# Patient Record
Sex: Male | Born: 1965 | Race: White | Hispanic: No | Marital: Single
Health system: Southern US, Community
[De-identification: ages and names within clinical notes are randomized; demographics above are authoritative.]

---

## 2007-02-10 ENCOUNTER — Inpatient Hospital Stay: Payer: Self-pay | Admitting: Internal Medicine

## 2007-02-10 ENCOUNTER — Other Ambulatory Visit: Payer: Self-pay

## 2007-02-18 ENCOUNTER — Ambulatory Visit: Payer: Self-pay | Admitting: Internal Medicine

## 2007-03-02 ENCOUNTER — Other Ambulatory Visit: Payer: Self-pay

## 2007-03-02 ENCOUNTER — Inpatient Hospital Stay: Payer: Self-pay | Admitting: Internal Medicine

## 2009-12-16 ENCOUNTER — Emergency Department: Payer: Self-pay | Admitting: Emergency Medicine

## 2010-02-08 ENCOUNTER — Inpatient Hospital Stay: Payer: Self-pay | Admitting: Specialist

## 2011-02-23 LAB — COMPREHENSIVE METABOLIC PANEL
Bilirubin,Total: 0.6 mg/dL (ref 0.2–1.0)
Calcium, Total: 9.4 mg/dL (ref 8.5–10.1)
Chloride: 103 mmol/L (ref 98–107)
Co2: 32 mmol/L (ref 21–32)
Creatinine: 1.67 mg/dL — ABNORMAL HIGH (ref 0.60–1.30)
EGFR (African American): 58 — ABNORMAL LOW
EGFR (Non-African Amer.): 48 — ABNORMAL LOW
Osmolality: 292 (ref 275–301)
Potassium: 3.7 mmol/L (ref 3.5–5.1)
SGPT (ALT): 32 U/L
Sodium: 144 mmol/L (ref 136–145)
Total Protein: 6.9 g/dL (ref 6.4–8.2)

## 2011-02-23 LAB — CBC
MCH: 28.7 pg (ref 26.0–34.0)
MCHC: 32.5 g/dL (ref 32.0–36.0)
MCV: 88 fL (ref 80–100)
Platelet: 209 10*3/uL (ref 150–440)
RBC: 5.26 10*6/uL (ref 4.40–5.90)
RDW: 13.6 % (ref 11.5–14.5)
WBC: 9.6 10*3/uL (ref 3.8–10.6)

## 2011-02-23 LAB — PRO B NATRIURETIC PEPTIDE: B-Type Natriuretic Peptide: 3780 pg/mL — ABNORMAL HIGH (ref 0–125)

## 2011-02-24 ENCOUNTER — Inpatient Hospital Stay: Payer: Self-pay | Admitting: Internal Medicine

## 2011-02-24 LAB — BASIC METABOLIC PANEL
BUN: 21 mg/dL — ABNORMAL HIGH (ref 7–18)
Calcium, Total: 9.3 mg/dL (ref 8.5–10.1)
Chloride: 100 mmol/L (ref 98–107)
EGFR (African American): >60
EGFR (Non-African Amer.): 53 — ABNORMAL LOW
Glucose: 167 mg/dL — ABNORMAL HIGH (ref 65–99)
Osmolality: 284 (ref 275–301)
Potassium: 3.1 mmol/L — ABNORMAL LOW (ref 3.5–5.1)

## 2011-02-24 LAB — CK TOTAL AND CKMB (NOT AT ARMC)
CK, Total: 95 U/L (ref 35–232)
CK-MB: 2.5 ng/mL (ref 0.5–3.6)
CK-MB: 1.6 ng/mL (ref 0.5–3.6)

## 2011-02-24 LAB — MAGNESIUM: Magnesium: 1.3 mg/dL — ABNORMAL LOW

## 2011-02-24 LAB — TROPONIN I: Troponin-I: 0.03 ng/mL

## 2011-02-25 LAB — BASIC METABOLIC PANEL
BUN: 24 mg/dL — ABNORMAL HIGH (ref 7–18)
Calcium, Total: 8.6 mg/dL (ref 8.5–10.1)
Creatinine: 1.42 mg/dL — ABNORMAL HIGH (ref 0.60–1.30)
EGFR (African American): >60
EGFR (Non-African Amer.): 58 — ABNORMAL LOW
Glucose: 119 mg/dL — ABNORMAL HIGH (ref 65–99)
Sodium: 139 mmol/L (ref 136–145)

## 2011-02-25 LAB — URINALYSIS, COMPLETE
Bacteria: NONE SEEN
Bilirubin,UR: NEGATIVE
Glucose,UR: NEGATIVE mg/dL (ref 0–75)
Ketone: NEGATIVE
Nitrite: NEGATIVE
Ph: 6 (ref 4.5–8.0)
Specific Gravity: 1.008 (ref 1.003–1.030)
Squamous Epithelial: NONE SEEN
WBC UR: <1 /HPF (ref 0–5)

## 2011-02-26 LAB — BASIC METABOLIC PANEL
BUN: 18 mg/dL (ref 7–18)
Chloride: 100 mmol/L (ref 98–107)
Creatinine: 1.24 mg/dL (ref 0.60–1.30)
EGFR (African American): >60
EGFR (Non-African Amer.): >60
Glucose: 98 mg/dL (ref 65–99)
Osmolality: 281 (ref 275–301)
Potassium: 3.5 mmol/L (ref 3.5–5.1)

## 2011-02-26 LAB — MAGNESIUM: Magnesium: 2 mg/dL

## 2011-02-26 LAB — APTT: Activated PTT: 91.5 secs — ABNORMAL HIGH (ref 23.6–35.9)

## 2011-02-27 LAB — CBC WITH DIFFERENTIAL/PLATELET
Eosinophil %: 1.3 %
HCT: 40.1 % (ref 40.0–52.0)
Lymphocyte #: 1.5 10*3/uL (ref 1.0–3.6)
MCH: 28.4 pg (ref 26.0–34.0)
MCHC: 32.3 g/dL (ref 32.0–36.0)
MCV: 88 fL (ref 80–100)
Monocyte #: 1.1 10*3/uL — ABNORMAL HIGH (ref 0.0–0.7)
Monocyte %: 10.9 %
Neutrophil #: 7 10*3/uL — ABNORMAL HIGH (ref 1.4–6.5)
Neutrophil %: 72.3 %
Platelet: 170 10*3/uL (ref 150–440)
RBC: 4.56 10*6/uL (ref 4.40–5.90)
WBC: 9.7 10*3/uL (ref 3.8–10.6)

## 2011-02-27 LAB — BASIC METABOLIC PANEL
Anion Gap: 9 (ref 7–16)
Calcium, Total: 8.1 mg/dL — ABNORMAL LOW (ref 8.5–10.1)
EGFR (African American): >60
EGFR (Non-African Amer.): >60
Glucose: 110 mg/dL — ABNORMAL HIGH (ref 65–99)
Osmolality: 276 (ref 275–301)

## 2011-02-27 LAB — URINE CULTURE

## 2011-02-28 LAB — BASIC METABOLIC PANEL
Anion Gap: 9 (ref 7–16)
BUN: 17 mg/dL (ref 7–18)
Co2: 31 mmol/L (ref 21–32)
Creatinine: 0.96 mg/dL (ref 0.60–1.30)
Glucose: 91 mg/dL (ref 65–99)

## 2011-02-28 LAB — PROTIME-INR
INR: 1.1
Prothrombin Time: 14.7 secs (ref 11.5–14.7)

## 2012-02-06 ENCOUNTER — Emergency Department: Payer: Self-pay

## 2012-02-06 LAB — CK TOTAL AND CKMB (NOT AT ARMC)
CK, Total: 256 U/L — ABNORMAL HIGH (ref 35–232)
CK-MB: 2.3 ng/mL (ref 0.5–3.6)

## 2012-02-06 LAB — BASIC METABOLIC PANEL
BUN: 12 mg/dL (ref 7–18)
Chloride: 104 mmol/L (ref 98–107)
Co2: 27 mmol/L (ref 21–32)
Creatinine: 1.13 mg/dL (ref 0.60–1.30)
EGFR (Non-African Amer.): >60
Glucose: 129 mg/dL — ABNORMAL HIGH (ref 65–99)
Osmolality: 279 (ref 275–301)
Potassium: 3.7 mmol/L (ref 3.5–5.1)
Sodium: 139 mmol/L (ref 136–145)

## 2012-02-06 LAB — TROPONIN I: Troponin-I: 0.03 ng/mL

## 2012-02-06 LAB — CBC
HCT: 50 % (ref 40.0–52.0)
MCV: 86 fL (ref 80–100)
RBC: 5.79 10*6/uL (ref 4.40–5.90)
RDW: 14.5 % (ref 11.5–14.5)
WBC: 11 10*3/uL — ABNORMAL HIGH (ref 3.8–10.6)

## 2012-02-06 LAB — APTT: Activated PTT: 30.4 secs (ref 23.6–35.9)

## 2012-02-06 LAB — PROTIME-INR: INR: 1

## 2012-09-03 ENCOUNTER — Inpatient Hospital Stay: Payer: Self-pay | Admitting: Internal Medicine

## 2012-09-03 LAB — COMPREHENSIVE METABOLIC PANEL
Anion Gap: 8 (ref 7–16)
BUN: 15 mg/dL (ref 7–18)
Co2: 28 mmol/L (ref 21–32)
Creatinine: 2 mg/dL — ABNORMAL HIGH (ref 0.60–1.30)
EGFR (African American): 45 — ABNORMAL LOW
Glucose: 128 mg/dL — ABNORMAL HIGH (ref 65–99)
Osmolality: 276 (ref 275–301)
Potassium: 3.3 mmol/L — ABNORMAL LOW (ref 3.5–5.1)
SGOT(AST): 15 U/L (ref 15–37)
SGPT (ALT): 15 U/L (ref 12–78)
Sodium: 137 mmol/L (ref 136–145)

## 2012-09-03 LAB — URINALYSIS, COMPLETE
Glucose,UR: NEGATIVE mg/dL (ref 0–75)
Ph: 5 (ref 4.5–8.0)
Protein: 100
RBC,UR: 2 /HPF (ref 0–5)
Specific Gravity: 1.024 (ref 1.003–1.030)
Squamous Epithelial: NONE SEEN
WBC UR: 3 /HPF (ref 0–5)

## 2012-09-03 LAB — CBC
HCT: 41.6 % (ref 40.0–52.0)
HGB: 13.7 g/dL (ref 13.0–18.0)
MCH: 28 pg (ref 26.0–34.0)
MCHC: 33 g/dL (ref 32.0–36.0)
MCV: 85 fL (ref 80–100)
Platelet: 223 x10 3/mm 3 (ref 150–440)
RBC: 4.89 x10 6/mm 3 (ref 4.40–5.90)
RDW: 15.6 % — ABNORMAL HIGH (ref 11.5–14.5)
WBC: 16.4 x10 3/mm 3 — ABNORMAL HIGH (ref 3.8–10.6)

## 2012-09-03 LAB — TROPONIN I: Troponin-I: 0.07 ng/mL — ABNORMAL HIGH

## 2012-09-03 LAB — CK TOTAL AND CKMB (NOT AT ARMC)
CK, Total: 131 U/L (ref 35–232)
CK-MB: 0.5 ng/mL (ref 0.5–3.6)

## 2012-09-03 LAB — APTT: Activated PTT: 29.2 s (ref 23.6–35.9)

## 2012-09-04 LAB — APTT
Activated PTT: 61.9 secs — ABNORMAL HIGH (ref 23.6–35.9)
Activated PTT: 45.7 secs — ABNORMAL HIGH (ref 23.6–35.9)
Activated PTT: 87.2 secs — ABNORMAL HIGH (ref 23.6–35.9)

## 2012-09-04 LAB — BASIC METABOLIC PANEL
Anion Gap: 7 (ref 7–16)
BUN: 18 mg/dL (ref 7–18)
Chloride: 100 mmol/L (ref 98–107)
EGFR (African American): >60
EGFR (Non-African Amer.): 59 — ABNORMAL LOW
Glucose: 121 mg/dL — ABNORMAL HIGH (ref 65–99)
Osmolality: 271 (ref 275–301)
Sodium: 134 mmol/L — ABNORMAL LOW (ref 136–145)

## 2012-09-04 LAB — CBC WITH DIFFERENTIAL/PLATELET
Basophil #: 0 10*3/uL (ref 0.0–0.1)
Eosinophil #: 0 10*3/uL (ref 0.0–0.7)
HCT: 40.3 % (ref 40.0–52.0)
Lymphocyte #: 0.5 10*3/uL — ABNORMAL LOW (ref 1.0–3.6)
Lymphocyte %: 2.5 %
MCH: 28.2 pg (ref 26.0–34.0)
Monocyte #: 0.4 x10 3/mm (ref 0.2–1.0)
Platelet: 199 10*3/uL (ref 150–440)
RDW: 15.9 % — ABNORMAL HIGH (ref 11.5–14.5)
WBC: 19.7 10*3/uL — ABNORMAL HIGH (ref 3.8–10.6)

## 2012-09-04 LAB — CK TOTAL AND CKMB (NOT AT ARMC)
CK, Total: 104 U/L (ref 35–232)
CK, Total: 83 U/L (ref 35–232)
CK-MB: <0.5 ng/mL — ABNORMAL LOW (ref 0.5–3.6)
CK-MB: <0.5 ng/mL — ABNORMAL LOW (ref 0.5–3.6)

## 2012-09-04 LAB — LIPID PANEL
Ldl Cholesterol, Calc: 24 mg/dL (ref 0–100)
Triglycerides: 39 mg/dL (ref 0–200)

## 2012-09-04 LAB — TROPONIN I
Troponin-I: 0.07 ng/mL — ABNORMAL HIGH
Troponin-I: 0.06 ng/mL — ABNORMAL HIGH

## 2012-09-04 LAB — HEMOGLOBIN A1C: Hemoglobin A1C: 7.1 % — ABNORMAL HIGH (ref 4.2–6.3)

## 2012-09-04 LAB — MAGNESIUM: Magnesium: 1.1 mg/dL — ABNORMAL LOW

## 2012-09-04 LAB — TSH: Thyroid Stimulating Horm: 1.52 u[IU]/mL

## 2012-09-05 LAB — CBC WITH DIFFERENTIAL/PLATELET
Basophil #: 0 10*3/uL (ref 0.0–0.1)
Basophil %: 0.2 %
Eosinophil #: 0 10*3/uL (ref 0.0–0.7)
Eosinophil %: 0.1 %
HCT: 39.2 % — ABNORMAL LOW (ref 40.0–52.0)
HGB: 13 g/dL (ref 13.0–18.0)
Lymphocyte #: 0.8 10*3/uL — ABNORMAL LOW (ref 1.0–3.6)
Lymphocyte %: 4.9 %
MCH: 28.3 pg (ref 26.0–34.0)
MCHC: 33.3 g/dL (ref 32.0–36.0)
MCV: 85 fL (ref 80–100)
Monocyte #: 0.8 x10 3/mm (ref 0.2–1.0)
Monocyte %: 4.7 %
Neutrophil #: 14.6 10*3/uL — ABNORMAL HIGH (ref 1.4–6.5)
Neutrophil %: 90.1 %
Platelet: 180 10*3/uL (ref 150–440)
RBC: 4.6 10*6/uL (ref 4.40–5.90)
RDW: 15.7 % — ABNORMAL HIGH (ref 11.5–14.5)
WBC: 16.2 10*3/uL — ABNORMAL HIGH (ref 3.8–10.6)

## 2012-09-05 LAB — PROTIME-INR
INR: 1.5
Prothrombin Time: 17.8 secs — ABNORMAL HIGH (ref 11.5–14.7)

## 2012-09-05 LAB — BASIC METABOLIC PANEL
Anion Gap: 9 (ref 7–16)
BUN: 29 mg/dL — ABNORMAL HIGH (ref 7–18)
Calcium, Total: 8.8 mg/dL (ref 8.5–10.1)
Chloride: 94 mmol/L — ABNORMAL LOW (ref 98–107)
Co2: 27 mmol/L (ref 21–32)
Creatinine: 1.78 mg/dL — ABNORMAL HIGH (ref 0.60–1.30)
EGFR (African American): 52 — ABNORMAL LOW
EGFR (Non-African Amer.): 45 — ABNORMAL LOW
Glucose: 118 mg/dL — ABNORMAL HIGH (ref 65–99)
Osmolality: 268 (ref 275–301)
Potassium: 3.8 mmol/L (ref 3.5–5.1)
Sodium: 130 mmol/L — ABNORMAL LOW (ref 136–145)

## 2012-09-05 LAB — APTT
Activated PTT: 54.3 secs — ABNORMAL HIGH (ref 23.6–35.9)
Activated PTT: 54.5 secs — ABNORMAL HIGH (ref 23.6–35.9)
Activated PTT: 75.7 secs — ABNORMAL HIGH (ref 23.6–35.9)

## 2012-09-05 LAB — URINE CULTURE

## 2012-09-05 LAB — MAGNESIUM: Magnesium: 1.8 mg/dL

## 2012-09-06 LAB — BASIC METABOLIC PANEL
Calcium, Total: 8.9 mg/dL (ref 8.5–10.1)
Chloride: 94 mmol/L — ABNORMAL LOW (ref 98–107)
EGFR (African American): >60
EGFR (Non-African Amer.): >60
Sodium: 130 mmol/L — ABNORMAL LOW (ref 136–145)

## 2012-09-06 LAB — CBC WITH DIFFERENTIAL/PLATELET
Basophil #: 0 10*3/uL (ref 0.0–0.1)
Basophil %: 0.4 %
Eosinophil %: 0.3 %
HCT: 37.3 % — ABNORMAL LOW (ref 40.0–52.0)
HGB: 12.4 g/dL — ABNORMAL LOW (ref 13.0–18.0)
Lymphocyte %: 7.8 %
MCHC: 33.2 g/dL (ref 32.0–36.0)
MCV: 85 fL (ref 80–100)
Monocyte #: 0.8 x10 3/mm (ref 0.2–1.0)
Neutrophil %: 85.5 %
Platelet: 180 10*3/uL (ref 150–440)
RBC: 4.4 10*6/uL (ref 4.40–5.90)
RDW: 15.3 % — ABNORMAL HIGH (ref 11.5–14.5)
WBC: 13.3 10*3/uL — ABNORMAL HIGH (ref 3.8–10.6)

## 2012-09-06 LAB — APTT: Activated PTT: 64.7 secs — ABNORMAL HIGH (ref 23.6–35.9)

## 2012-09-06 LAB — PROTIME-INR
INR: 1.6
Prothrombin Time: 18.6 secs — ABNORMAL HIGH (ref 11.5–14.7)

## 2012-09-07 LAB — BASIC METABOLIC PANEL
Anion Gap: 9 (ref 7–16)
BUN: 25 mg/dL — ABNORMAL HIGH (ref 7–18)
Calcium, Total: 9.2 mg/dL (ref 8.5–10.1)
Chloride: 95 mmol/L — ABNORMAL LOW (ref 98–107)
Co2: 29 mmol/L (ref 21–32)
Creatinine: 0.86 mg/dL (ref 0.60–1.30)
EGFR (African American): >60
EGFR (Non-African Amer.): >60
Glucose: 91 mg/dL (ref 65–99)
Osmolality: 270 (ref 275–301)
Potassium: 4.8 mmol/L (ref 3.5–5.1)
Sodium: 133 mmol/L — ABNORMAL LOW (ref 136–145)

## 2012-09-07 LAB — PROTIME-INR: INR: 1.8

## 2012-09-07 LAB — APTT: Activated PTT: 96.4 secs — ABNORMAL HIGH (ref 23.6–35.9)

## 2012-09-08 LAB — BASIC METABOLIC PANEL
Anion Gap: 4 — ABNORMAL LOW (ref 7–16)
BUN: 19 mg/dL — ABNORMAL HIGH (ref 7–18)
Calcium, Total: 9.3 mg/dL (ref 8.5–10.1)
Chloride: 94 mmol/L — ABNORMAL LOW (ref 98–107)
Co2: 33 mmol/L — ABNORMAL HIGH (ref 21–32)
Creatinine: 1.03 mg/dL (ref 0.60–1.30)
EGFR (Non-African Amer.): >60
Glucose: 90 mg/dL (ref 65–99)
Potassium: 3.7 mmol/L (ref 3.5–5.1)

## 2012-09-08 LAB — APTT: Activated PTT: 61.4 secs — ABNORMAL HIGH (ref 23.6–35.9)

## 2012-09-08 LAB — HEMOGLOBIN: HGB: 14.2 g/dL (ref 13.0–18.0)

## 2012-09-09 LAB — PROTIME-INR
INR: 1.8
Prothrombin Time: 20.5 secs — ABNORMAL HIGH (ref 11.5–14.7)

## 2012-09-09 LAB — APTT
Activated PTT: 55.9 secs — ABNORMAL HIGH (ref 23.6–35.9)
Activated PTT: 63.3 secs — ABNORMAL HIGH (ref 23.6–35.9)

## 2012-09-10 LAB — APTT: Activated PTT: 135.5 secs — ABNORMAL HIGH (ref 23.6–35.9)

## 2012-09-10 LAB — CBC WITH DIFFERENTIAL/PLATELET
Eosinophil %: 2.5 %
HCT: 37.6 % — ABNORMAL LOW (ref 40.0–52.0)
HGB: 12.5 g/dL — ABNORMAL LOW (ref 13.0–18.0)
Lymphocyte #: 1 10*3/uL (ref 1.0–3.6)
MCH: 28.1 pg (ref 26.0–34.0)
MCHC: 33.3 g/dL (ref 32.0–36.0)
MCV: 85 fL (ref 80–100)
Monocyte #: 0.8 x10 3/mm (ref 0.2–1.0)
Monocyte %: 8.8 %
Neutrophil %: 76 %
RBC: 4.45 10*6/uL (ref 4.40–5.90)
RDW: 15.8 % — ABNORMAL HIGH (ref 11.5–14.5)
WBC: 8.7 10*3/uL (ref 3.8–10.6)

## 2012-09-10 LAB — BASIC METABOLIC PANEL
Anion Gap: 4 — ABNORMAL LOW (ref 7–16)
BUN: 15 mg/dL (ref 7–18)
Calcium, Total: 8.8 mg/dL (ref 8.5–10.1)
Chloride: 89 mmol/L — ABNORMAL LOW (ref 98–107)
Co2: 40 mmol/L (ref 21–32)
Creatinine: 0.88 mg/dL (ref 0.60–1.30)
EGFR (African American): >60
Osmolality: 267 (ref 275–301)
Potassium: 3.4 mmol/L — ABNORMAL LOW (ref 3.5–5.1)

## 2012-09-10 LAB — PROTIME-INR: INR: 1.8

## 2012-09-11 LAB — PROTIME-INR: INR: 1.9

## 2012-09-11 LAB — APTT: Activated PTT: >160 secs (ref 23.6–35.9)

## 2012-09-12 LAB — PLATELET COUNT: Platelet: 390 10*3/uL (ref 150–440)

## 2012-09-12 LAB — BASIC METABOLIC PANEL
Anion Gap: 4 — ABNORMAL LOW (ref 7–16)
Chloride: 89 mmol/L — ABNORMAL LOW (ref 98–107)
Co2: 40 mmol/L (ref 21–32)
Creatinine: 0.94 mg/dL (ref 0.60–1.30)
EGFR (African American): >60
EGFR (Non-African Amer.): >60
Potassium: 3.5 mmol/L (ref 3.5–5.1)
Sodium: 133 mmol/L — ABNORMAL LOW (ref 136–145)

## 2012-09-12 LAB — PROTIME-INR: Prothrombin Time: 20 secs — ABNORMAL HIGH (ref 11.5–14.7)

## 2012-09-12 LAB — MAGNESIUM: Magnesium: 2 mg/dL

## 2012-09-12 LAB — APTT: Activated PTT: 92.9 secs — ABNORMAL HIGH (ref 23.6–35.9)

## 2012-09-13 LAB — APTT: Activated PTT: 149.9 secs — ABNORMAL HIGH (ref 23.6–35.9)

## 2012-09-13 LAB — PROTIME-INR: Prothrombin Time: 21.4 secs — ABNORMAL HIGH (ref 11.5–14.7)

## 2012-09-14 LAB — HEMOGLOBIN: HGB: 12.8 g/dL — ABNORMAL LOW (ref 13.0–18.0)

## 2012-09-14 LAB — PLATELET COUNT: Platelet: 414 10*3/uL (ref 150–440)

## 2012-09-14 LAB — APTT
Activated PTT: 112.6 secs — ABNORMAL HIGH (ref 23.6–35.9)
Activated PTT: 97.2 secs — ABNORMAL HIGH (ref 23.6–35.9)

## 2012-09-14 LAB — PROTIME-INR
INR: 1.9
Prothrombin Time: 21.1 secs — ABNORMAL HIGH (ref 11.5–14.7)

## 2012-12-20 ENCOUNTER — Inpatient Hospital Stay: Payer: Self-pay | Admitting: Internal Medicine

## 2012-12-20 LAB — CBC
HCT: 39.9 % — ABNORMAL LOW (ref 40.0–52.0)
MCH: 27.5 pg (ref 26.0–34.0)
MCV: 82 fL (ref 80–100)
Platelet: 266 10*3/uL (ref 150–440)
WBC: 14.4 10*3/uL — ABNORMAL HIGH (ref 3.8–10.6)

## 2012-12-20 LAB — URINALYSIS, COMPLETE
Bacteria: NONE SEEN
Ketone: NEGATIVE
Leukocyte Esterase: NEGATIVE
Nitrite: NEGATIVE
RBC,UR: <1 /HPF (ref 0–5)
Squamous Epithelial: NONE SEEN
WBC UR: <1 /HPF (ref 0–5)

## 2012-12-20 LAB — COMPREHENSIVE METABOLIC PANEL
Albumin: 2.4 g/dL — ABNORMAL LOW (ref 3.4–5.0)
BUN: 22 mg/dL — ABNORMAL HIGH (ref 7–18)
Bilirubin,Total: 0.6 mg/dL (ref 0.2–1.0)
Co2: 26 mmol/L (ref 21–32)
Creatinine: 1.17 mg/dL (ref 0.60–1.30)
EGFR (African American): >60
Glucose: 131 mg/dL — ABNORMAL HIGH (ref 65–99)
Osmolality: 272 (ref 275–301)
Potassium: 3.1 mmol/L — ABNORMAL LOW (ref 3.5–5.1)
SGPT (ALT): 28 U/L (ref 12–78)
Total Protein: 8.6 g/dL — ABNORMAL HIGH (ref 6.4–8.2)

## 2012-12-20 LAB — PROTIME-INR: Prothrombin Time: 23.1 secs — ABNORMAL HIGH (ref 11.5–14.7)

## 2012-12-20 LAB — APTT: Activated PTT: 42.8 secs — ABNORMAL HIGH (ref 23.6–35.9)

## 2012-12-20 LAB — MAGNESIUM: Magnesium: 1.4 mg/dL — ABNORMAL LOW

## 2012-12-21 LAB — CBC WITH DIFFERENTIAL/PLATELET
Basophil %: 0.3 %
Eosinophil #: 0 10*3/uL (ref 0.0–0.7)
HCT: 34 % — ABNORMAL LOW (ref 40.0–52.0)
Lymphocyte #: 1 10*3/uL (ref 1.0–3.6)
Lymphocyte %: 7.7 %
MCH: 27.4 pg (ref 26.0–34.0)
MCHC: 33.4 g/dL (ref 32.0–36.0)
MCV: 82 fL (ref 80–100)
Monocyte %: 9.6 %
Neutrophil #: 10.5 10*3/uL — ABNORMAL HIGH (ref 1.4–6.5)
Neutrophil %: 82.1 %
Platelet: 231 10*3/uL (ref 150–440)
RDW: 16.6 % — ABNORMAL HIGH (ref 11.5–14.5)

## 2012-12-21 LAB — BASIC METABOLIC PANEL
Anion Gap: 7 (ref 7–16)
Chloride: 98 mmol/L (ref 98–107)
Co2: 30 mmol/L (ref 21–32)
Creatinine: 0.97 mg/dL (ref 0.60–1.30)
EGFR (African American): >60
Glucose: 100 mg/dL — ABNORMAL HIGH (ref 65–99)
Osmolality: 269 (ref 275–301)
Potassium: 3.3 mmol/L — ABNORMAL LOW (ref 3.5–5.1)
Sodium: 135 mmol/L — ABNORMAL LOW (ref 136–145)

## 2012-12-21 LAB — HEMOGLOBIN A1C: Hemoglobin A1C: 6.5 % — ABNORMAL HIGH (ref 4.2–6.3)

## 2012-12-21 LAB — VANCOMYCIN, TROUGH: Vancomycin, Trough: 8 ug/mL — ABNORMAL LOW (ref 10–20)

## 2012-12-22 LAB — CREATININE, SERUM
Creatinine: 0.82 mg/dL (ref 0.60–1.30)
EGFR (African American): >60

## 2012-12-22 LAB — BASIC METABOLIC PANEL
Anion Gap: 12 (ref 7–16)
BUN: 27 mg/dL — ABNORMAL HIGH (ref 7–18)
Calcium, Total: 7.7 mg/dL — ABNORMAL LOW (ref 8.5–10.1)
Chloride: 102 mmol/L (ref 98–107)
Co2: 21 mmol/L (ref 21–32)
Osmolality: 279 (ref 275–301)

## 2012-12-22 LAB — HEMOGLOBIN
HGB: 9.4 g/dL — ABNORMAL LOW (ref 13.0–18.0)
HGB: 8.2 g/dL — ABNORMAL LOW (ref 13.0–18.0)
HGB: 7.2 g/dL — ABNORMAL LOW (ref 13.0–18.0)

## 2012-12-22 LAB — PROTIME-INR
INR: 2.5
Prothrombin Time: 26.2 secs — ABNORMAL HIGH (ref 11.5–14.7)
Prothrombin Time: 23.8 secs — ABNORMAL HIGH (ref 11.5–14.7)

## 2012-12-22 LAB — DIGOXIN LEVEL: Digoxin: 0.32 ng/mL

## 2012-12-23 LAB — BASIC METABOLIC PANEL
Anion Gap: 4 — ABNORMAL LOW (ref 7–16)
Anion Gap: 10 (ref 7–16)
Anion Gap: 9 (ref 7–16)
BUN: 35 mg/dL — ABNORMAL HIGH (ref 7–18)
BUN: 49 mg/dL — ABNORMAL HIGH (ref 7–18)
Calcium, Total: 7.6 mg/dL — ABNORMAL LOW (ref 8.5–10.1)
Calcium, Total: 8.2 mg/dL — ABNORMAL LOW (ref 8.5–10.1)
Chloride: 103 mmol/L (ref 98–107)
Chloride: 107 mmol/L (ref 98–107)
Chloride: 103 mmol/L (ref 98–107)
Chloride: 104 mmol/L (ref 98–107)
Co2: 24 mmol/L (ref 21–32)
Creatinine: 3.17 mg/dL — ABNORMAL HIGH (ref 0.60–1.30)
EGFR (African American): 28 — ABNORMAL LOW
EGFR (Non-African Amer.): 24 — ABNORMAL LOW
Glucose: 133 mg/dL — ABNORMAL HIGH (ref 65–99)
Osmolality: 282 (ref 275–301)
Osmolality: 289 (ref 275–301)
Potassium: 4.5 mmol/L (ref 3.5–5.1)
Potassium: 4.8 mmol/L (ref 3.5–5.1)
Potassium: 4.6 mmol/L (ref 3.5–5.1)
Sodium: 135 mmol/L — ABNORMAL LOW (ref 136–145)
Sodium: 137 mmol/L (ref 136–145)

## 2012-12-23 LAB — CBC WITH DIFFERENTIAL/PLATELET
Basophil #: 0.4 10*3/uL — ABNORMAL HIGH (ref 0.0–0.1)
Basophil %: 1.1 %
Basophil: 1 %
Eosinophil %: 0.1 %
HCT: 23.5 % — ABNORMAL LOW (ref 40.0–52.0)
HGB: 7.2 g/dL — ABNORMAL LOW (ref 13.0–18.0)
HGB: 6.1 g/dL — ABNORMAL LOW (ref 13.0–18.0)
Lymphocyte #: 2.3 10*3/uL (ref 1.0–3.6)
Lymphocyte %: 6.5 %
MCH: 27.4 pg (ref 26.0–34.0)
MCV: 86 fL (ref 80–100)
MCV: 84 fL (ref 80–100)
Metamyelocyte: 1 %
Monocyte %: 8.2 %
Neutrophil %: 84.1 %
Platelet: 320 10*3/uL (ref 150–440)
Platelet: 226 10*3/uL (ref 150–440)
RBC: 2.73 10*6/uL — ABNORMAL LOW (ref 4.40–5.90)
RBC: 2.24 10*6/uL — ABNORMAL LOW (ref 4.40–5.90)
RDW: 15.5 % — ABNORMAL HIGH (ref 11.5–14.5)
WBC: 35.7 10*3/uL — ABNORMAL HIGH (ref 3.8–10.6)
WBC: 20 10*3/uL — ABNORMAL HIGH (ref 3.8–10.6)

## 2012-12-23 LAB — PROTIME-INR
INR: 2.1
INR: 1.5
Prothrombin Time: 17.8 secs — ABNORMAL HIGH (ref 11.5–14.7)

## 2012-12-23 LAB — PHOSPHORUS: Phosphorus: 5.9 mg/dL — ABNORMAL HIGH (ref 2.5–4.9)

## 2012-12-23 LAB — VANCOMYCIN, TROUGH: Vancomycin, Trough: 42 ug/mL (ref 10–20)

## 2012-12-23 LAB — MAGNESIUM: Magnesium: 1.7 mg/dL — ABNORMAL LOW

## 2012-12-24 LAB — BASIC METABOLIC PANEL
Anion Gap: 7 (ref 7–16)
Anion Gap: 6 — ABNORMAL LOW (ref 7–16)
Anion Gap: 8 (ref 7–16)
BUN: 45 mg/dL — ABNORMAL HIGH (ref 7–18)
Calcium, Total: 8.2 mg/dL — ABNORMAL LOW (ref 8.5–10.1)
Calcium, Total: 8 mg/dL — ABNORMAL LOW (ref 8.5–10.1)
Calcium, Total: 8.2 mg/dL — ABNORMAL LOW (ref 8.5–10.1)
Calcium, Total: 7.7 mg/dL — ABNORMAL LOW (ref 8.5–10.1)
Chloride: 103 mmol/L (ref 98–107)
Chloride: 105 mmol/L (ref 98–107)
Chloride: 105 mmol/L (ref 98–107)
Co2: 26 mmol/L (ref 21–32)
Co2: 25 mmol/L (ref 21–32)
Co2: 26 mmol/L (ref 21–32)
Co2: 26 mmol/L (ref 21–32)
Creatinine: 2.81 mg/dL — ABNORMAL HIGH (ref 0.60–1.30)
Creatinine: 2.7 mg/dL — ABNORMAL HIGH (ref 0.60–1.30)
Creatinine: 2.75 mg/dL — ABNORMAL HIGH (ref 0.60–1.30)
EGFR (African American): 27 — ABNORMAL LOW
EGFR (African American): 30 — ABNORMAL LOW
EGFR (African American): 31 — ABNORMAL LOW
EGFR (African American): 31 — ABNORMAL LOW
EGFR (Non-African Amer.): 24 — ABNORMAL LOW
EGFR (Non-African Amer.): 26 — ABNORMAL LOW
EGFR (Non-African Amer.): 27 — ABNORMAL LOW
EGFR (Non-African Amer.): 26 — ABNORMAL LOW
Glucose: 137 mg/dL — ABNORMAL HIGH (ref 65–99)
Glucose: 149 mg/dL — ABNORMAL HIGH (ref 65–99)
Osmolality: 286 (ref 275–301)
Osmolality: 285 (ref 275–301)
Osmolality: 285 (ref 275–301)
Potassium: 4.7 mmol/L (ref 3.5–5.1)
Potassium: 4.4 mmol/L (ref 3.5–5.1)
Potassium: 4 mmol/L (ref 3.5–5.1)
Potassium: 3.7 mmol/L (ref 3.5–5.1)
Sodium: 138 mmol/L (ref 136–145)
Sodium: 137 mmol/L (ref 136–145)
Sodium: 139 mmol/L (ref 136–145)
Sodium: 136 mmol/L (ref 136–145)

## 2012-12-24 LAB — HEMOGLOBIN
HGB: 7.9 g/dL — ABNORMAL LOW (ref 13.0–18.0)
HGB: 7.9 g/dL — ABNORMAL LOW (ref 13.0–18.0)

## 2012-12-24 LAB — CBC WITH DIFFERENTIAL/PLATELET
Basophil #: 0.1 10*3/uL (ref 0.0–0.1)
Basophil %: 0.2 %
Eosinophil %: 0.1 %
HGB: 8.4 g/dL — ABNORMAL LOW (ref 13.0–18.0)
Lymphocyte %: 3.3 %
MCHC: 33 g/dL (ref 32.0–36.0)
MCV: 85 fL (ref 80–100)
Monocyte #: 1.2 x10 3/mm — ABNORMAL HIGH (ref 0.2–1.0)
Monocyte %: 3.6 %
Neutrophil %: 92.8 %
RBC: 3 10*6/uL — ABNORMAL LOW (ref 4.40–5.90)

## 2012-12-24 LAB — PROTIME-INR
INR: 1.3
Prothrombin Time: 16.5 secs — ABNORMAL HIGH (ref 11.5–14.7)

## 2012-12-24 LAB — COMPREHENSIVE METABOLIC PANEL
Alkaline Phosphatase: 79 U/L
Anion Gap: 9 (ref 7–16)
BUN: 42 mg/dL — ABNORMAL HIGH (ref 7–18)
Bilirubin,Total: 0.6 mg/dL (ref 0.2–1.0)
Calcium, Total: 7.9 mg/dL — ABNORMAL LOW (ref 8.5–10.1)
Chloride: 105 mmol/L (ref 98–107)
Co2: 23 mmol/L (ref 21–32)
Creatinine: 2.93 mg/dL — ABNORMAL HIGH (ref 0.60–1.30)
EGFR (Non-African Amer.): 24 — ABNORMAL LOW
Osmolality: 287 (ref 275–301)
Potassium: 4.2 mmol/L (ref 3.5–5.1)
SGPT (ALT): 995 U/L — ABNORMAL HIGH (ref 12–78)

## 2012-12-24 LAB — VANCOMYCIN, TROUGH: Vancomycin, Trough: 22 ug/mL (ref 10–20)

## 2012-12-24 LAB — WOUND CULTURE

## 2012-12-24 LAB — PHOSPHORUS: Phosphorus: 4.3 mg/dL (ref 2.5–4.9)

## 2012-12-25 LAB — BASIC METABOLIC PANEL
BUN: 34 mg/dL — ABNORMAL HIGH (ref 7–18)
Chloride: 104 mmol/L (ref 98–107)
Creatinine: 2.6 mg/dL — ABNORMAL HIGH (ref 0.60–1.30)
EGFR (African American): 33 — ABNORMAL LOW
EGFR (Non-African Amer.): 28 — ABNORMAL LOW
Glucose: 146 mg/dL — ABNORMAL HIGH (ref 65–99)
Osmolality: 286 (ref 275–301)
Sodium: 138 mmol/L (ref 136–145)

## 2012-12-25 LAB — COMPREHENSIVE METABOLIC PANEL
Alkaline Phosphatase: 73 U/L
Anion Gap: 6 — ABNORMAL LOW (ref 7–16)
BUN: 33 mg/dL — ABNORMAL HIGH (ref 7–18)
Bilirubin,Total: 0.3 mg/dL (ref 0.2–1.0)
Chloride: 105 mmol/L (ref 98–107)
Creatinine: 2.61 mg/dL — ABNORMAL HIGH (ref 0.60–1.30)
EGFR (African American): 33 — ABNORMAL LOW
Glucose: 137 mg/dL — ABNORMAL HIGH (ref 65–99)
Osmolality: 287 (ref 275–301)
SGOT(AST): 598 U/L — ABNORMAL HIGH (ref 15–37)
SGPT (ALT): 598 U/L — ABNORMAL HIGH (ref 12–78)
Total Protein: 5.2 g/dL — ABNORMAL LOW (ref 6.4–8.2)

## 2012-12-25 LAB — CBC WITH DIFFERENTIAL/PLATELET
Bands: 7 %
HCT: 19.1 % — ABNORMAL LOW (ref 40.0–52.0)
HGB: 6.4 g/dL — ABNORMAL LOW (ref 13.0–18.0)
MCH: 28.5 pg (ref 26.0–34.0)
MCV: 86 fL (ref 80–100)
Metamyelocyte: 1 %
Monocytes: 2 %
Platelet: 156 10*3/uL (ref 150–440)
WBC: 26.7 10*3/uL — ABNORMAL HIGH (ref 3.8–10.6)

## 2012-12-25 LAB — HEMOGLOBIN
HGB: 5.9 g/dL — ABNORMAL LOW (ref 13.0–18.0)
HGB: 6.9 g/dL — ABNORMAL LOW (ref 13.0–18.0)

## 2012-12-25 LAB — CULTURE, BLOOD (SINGLE)

## 2012-12-26 LAB — COMPREHENSIVE METABOLIC PANEL
Albumin: 1.6 g/dL — ABNORMAL LOW (ref 3.4–5.0)
Alkaline Phosphatase: 70 U/L
BUN: 35 mg/dL — ABNORMAL HIGH (ref 7–18)
Calcium, Total: 7.6 mg/dL — ABNORMAL LOW (ref 8.5–10.1)
Chloride: 101 mmol/L (ref 98–107)
Co2: 29 mmol/L (ref 21–32)
Creatinine: 3.29 mg/dL — ABNORMAL HIGH (ref 0.60–1.30)
EGFR (African American): 25 — ABNORMAL LOW
Glucose: 85 mg/dL (ref 65–99)
Osmolality: 275 (ref 275–301)
Potassium: 3.6 mmol/L (ref 3.5–5.1)
SGOT(AST): 226 U/L — ABNORMAL HIGH (ref 15–37)
SGPT (ALT): 381 U/L — ABNORMAL HIGH (ref 12–78)
Total Protein: 5.2 g/dL — ABNORMAL LOW (ref 6.4–8.2)

## 2012-12-26 LAB — CBC WITH DIFFERENTIAL/PLATELET
Comment - H1-Com3: NORMAL
Eosinophil: 1 %
HGB: 7.5 g/dL — ABNORMAL LOW (ref 13.0–18.0)
Lymphocytes: 9 %
MCHC: 33.4 g/dL (ref 32.0–36.0)
MCV: 88 fL (ref 80–100)
Metamyelocyte: 4 %
Myelocyte: 4 %
Platelet: 142 10*3/uL — ABNORMAL LOW (ref 150–440)
RDW: 14.8 % — ABNORMAL HIGH (ref 11.5–14.5)
Segmented Neutrophils: 71 %
WBC: 19.9 10*3/uL — ABNORMAL HIGH (ref 3.8–10.6)

## 2012-12-26 LAB — HEMOGLOBIN
HGB: 6.7 g/dL — ABNORMAL LOW (ref 13.0–18.0)
HGB: 7.2 g/dL — ABNORMAL LOW (ref 13.0–18.0)

## 2012-12-26 LAB — PROTIME-INR
INR: 1.2
Prothrombin Time: 15.7 secs — ABNORMAL HIGH (ref 11.5–14.7)

## 2012-12-27 LAB — CBC WITH DIFFERENTIAL/PLATELET
Bands: 4 %
Eosinophil: 1 %
HGB: 7.2 g/dL — ABNORMAL LOW (ref 13.0–18.0)
Lymphocytes: 10 %
MCH: 29.5 pg (ref 26.0–34.0)
MCHC: 33.5 g/dL (ref 32.0–36.0)
Metamyelocyte: 2 %
Myelocyte: 1 %
Platelet: 160 10*3/uL (ref 150–440)
RBC: 2.45 10*6/uL — ABNORMAL LOW (ref 4.40–5.90)
RDW: 14.9 % — ABNORMAL HIGH (ref 11.5–14.5)
WBC: 16.8 10*3/uL — ABNORMAL HIGH (ref 3.8–10.6)

## 2012-12-27 LAB — BASIC METABOLIC PANEL
Anion Gap: 5 — ABNORMAL LOW (ref 7–16)
BUN: 36 mg/dL — ABNORMAL HIGH (ref 7–18)
Calcium, Total: 7.9 mg/dL — ABNORMAL LOW (ref 8.5–10.1)
Creatinine: 3.69 mg/dL — ABNORMAL HIGH (ref 0.60–1.30)
EGFR (Non-African Amer.): 19 — ABNORMAL LOW
Osmolality: 274 (ref 275–301)
Sodium: 133 mmol/L — ABNORMAL LOW (ref 136–145)

## 2012-12-27 LAB — PROTIME-INR: Prothrombin Time: 15.1 secs — ABNORMAL HIGH (ref 11.5–14.7)

## 2012-12-27 LAB — VANCOMYCIN, TROUGH: Vancomycin, Trough: 16 ug/mL (ref 10–20)

## 2012-12-28 LAB — CBC WITH DIFFERENTIAL/PLATELET
Bands: 5 %
Basophil: 1 %
Eosinophil: 1 %
HCT: 21.1 % — ABNORMAL LOW (ref 40.0–52.0)
HGB: 7.1 g/dL — ABNORMAL LOW (ref 13.0–18.0)
Lymphocytes: 8 %
MCHC: 33.6 g/dL (ref 32.0–36.0)
MCV: 87 fL (ref 80–100)
RBC: 2.42 10*6/uL — ABNORMAL LOW (ref 4.40–5.90)
Segmented Neutrophils: 77 %
WBC: 16.3 10*3/uL — ABNORMAL HIGH (ref 3.8–10.6)

## 2012-12-28 LAB — BASIC METABOLIC PANEL
Anion Gap: 6 — ABNORMAL LOW (ref 7–16)
BUN: 43 mg/dL — ABNORMAL HIGH (ref 7–18)
Calcium, Total: 7.9 mg/dL — ABNORMAL LOW (ref 8.5–10.1)
Chloride: 104 mmol/L (ref 98–107)
Co2: 26 mmol/L (ref 21–32)
EGFR (African American): 20 — ABNORMAL LOW
EGFR (Non-African Amer.): 18 — ABNORMAL LOW
Glucose: 86 mg/dL (ref 65–99)
Osmolality: 282 (ref 275–301)
Sodium: 136 mmol/L (ref 136–145)

## 2012-12-28 LAB — VANCOMYCIN, TROUGH: Vancomycin, Trough: 17 ug/mL (ref 10–20)

## 2012-12-29 LAB — BASIC METABOLIC PANEL
Calcium, Total: 8.2 mg/dL — ABNORMAL LOW (ref 8.5–10.1)
Chloride: 104 mmol/L (ref 98–107)
Creatinine: 3.83 mg/dL — ABNORMAL HIGH (ref 0.60–1.30)
EGFR (African American): 21 — ABNORMAL LOW
Sodium: 136 mmol/L (ref 136–145)

## 2012-12-29 LAB — CBC WITH DIFFERENTIAL/PLATELET
Bands: 2 %
Eosinophil: 1 %
HCT: 21.6 % — ABNORMAL LOW (ref 40.0–52.0)
HGB: 7.1 g/dL — ABNORMAL LOW (ref 13.0–18.0)
MCHC: 32.7 g/dL (ref 32.0–36.0)
Metamyelocyte: 1 %
Myelocyte: 1 %
Platelet: 240 10*3/uL (ref 150–440)
RDW: 15.8 % — ABNORMAL HIGH (ref 11.5–14.5)
Segmented Neutrophils: 81 %

## 2012-12-29 LAB — HEMOGLOBIN: HGB: 7.2 g/dL — ABNORMAL LOW (ref 13.0–18.0)

## 2012-12-30 LAB — BASIC METABOLIC PANEL
BUN: 40 mg/dL — ABNORMAL HIGH (ref 7–18)
Calcium, Total: 8.3 mg/dL — ABNORMAL LOW (ref 8.5–10.1)
Chloride: 105 mmol/L (ref 98–107)
EGFR (African American): 20 — ABNORMAL LOW
Osmolality: 285 (ref 275–301)
Potassium: 4 mmol/L (ref 3.5–5.1)
Sodium: 138 mmol/L (ref 136–145)

## 2012-12-30 LAB — HEMOGLOBIN: HGB: 7.6 g/dL — ABNORMAL LOW (ref 13.0–18.0)

## 2012-12-31 LAB — PROTEIN / CREATININE RATIO, URINE
Creatinine, Urine: 31 mg/dL (ref 30.0–125.0)
Protein, Random Urine: 9 mg/dL (ref 0–12)
Protein/Creat. Ratio: 290 mg/gCREAT — ABNORMAL HIGH (ref 0–200)

## 2012-12-31 LAB — CBC WITH DIFFERENTIAL/PLATELET
Basophil #: 0.1 10*3/uL (ref 0.0–0.1)
Eosinophil #: 0.2 10*3/uL (ref 0.0–0.7)
Eosinophil %: 2.6 %
HCT: 20.4 % — ABNORMAL LOW (ref 40.0–52.0)
HGB: 6.9 g/dL — ABNORMAL LOW (ref 13.0–18.0)
Lymphocyte #: 0.9 10*3/uL — ABNORMAL LOW (ref 1.0–3.6)
Lymphocyte %: 11.9 %
MCH: 30 pg (ref 26.0–34.0)
MCV: 88 fL (ref 80–100)
Monocyte #: 0.8 x10 3/mm (ref 0.2–1.0)
Monocyte %: 11 %
WBC: 7.7 10*3/uL (ref 3.8–10.6)

## 2012-12-31 LAB — URINALYSIS, COMPLETE
Bacteria: NONE SEEN
Bilirubin,UR: NEGATIVE
Blood: NEGATIVE
Ketone: NEGATIVE
Nitrite: NEGATIVE
Protein: NEGATIVE
RBC,UR: <1 /HPF (ref 0–5)
Specific Gravity: 1.004 (ref 1.003–1.030)
WBC UR: 1 /HPF (ref 0–5)

## 2012-12-31 LAB — BASIC METABOLIC PANEL
Anion Gap: 7 (ref 7–16)
BUN: 36 mg/dL — ABNORMAL HIGH (ref 7–18)
Calcium, Total: 8.5 mg/dL (ref 8.5–10.1)
Co2: 28 mmol/L (ref 21–32)
EGFR (African American): 18 — ABNORMAL LOW
EGFR (Non-African Amer.): 16 — ABNORMAL LOW
Glucose: 85 mg/dL (ref 65–99)
Osmolality: 283 (ref 275–301)
Potassium: 4.1 mmol/L (ref 3.5–5.1)

## 2012-12-31 LAB — VANCOMYCIN, TROUGH
Vancomycin, Trough: 23 ug/mL (ref 10–20)
Vancomycin, Trough: 24 ug/mL (ref 10–20)

## 2013-01-01 LAB — CBC WITH DIFFERENTIAL/PLATELET
Basophil %: 1.1 %
Eosinophil #: 0.2 10*3/uL (ref 0.0–0.7)
HGB: 7.7 g/dL — ABNORMAL LOW (ref 13.0–18.0)
Lymphocyte #: 1 10*3/uL (ref 1.0–3.6)
MCH: 30.8 pg (ref 26.0–34.0)
MCHC: 35.2 g/dL (ref 32.0–36.0)
MCV: 87 fL (ref 80–100)
Monocyte %: 11.6 %
Neutrophil %: 69.9 %
Platelet: 343 10*3/uL (ref 150–440)
RDW: 15.7 % — ABNORMAL HIGH (ref 11.5–14.5)

## 2013-01-01 LAB — BASIC METABOLIC PANEL
BUN: 33 mg/dL — ABNORMAL HIGH (ref 7–18)
Calcium, Total: 8 mg/dL — ABNORMAL LOW (ref 8.5–10.1)
Co2: 29 mmol/L (ref 21–32)
EGFR (Non-African Amer.): 16 — ABNORMAL LOW
Glucose: 115 mg/dL — ABNORMAL HIGH (ref 65–99)
Potassium: 4.2 mmol/L (ref 3.5–5.1)

## 2013-01-01 LAB — VANCOMYCIN, TROUGH: Vancomycin, Trough: 18 ug/mL (ref 10–20)

## 2013-01-02 LAB — CBC WITH DIFFERENTIAL/PLATELET
Basophil #: 0.1 10*3/uL (ref 0.0–0.1)
Eosinophil #: 0.2 10*3/uL (ref 0.0–0.7)
HGB: 7.3 g/dL — ABNORMAL LOW (ref 13.0–18.0)
Lymphocyte #: 1 10*3/uL (ref 1.0–3.6)
MCH: 29.3 pg (ref 26.0–34.0)
MCHC: 33.6 g/dL (ref 32.0–36.0)
Monocyte %: 12.2 %
Neutrophil #: 5 10*3/uL (ref 1.4–6.5)
Platelet: 357 10*3/uL (ref 150–440)
RBC: 2.48 10*6/uL — ABNORMAL LOW (ref 4.40–5.90)
WBC: 7.1 10*3/uL (ref 3.8–10.6)

## 2013-01-02 LAB — BASIC METABOLIC PANEL
Anion Gap: 4 — ABNORMAL LOW (ref 7–16)
Calcium, Total: 8 mg/dL — ABNORMAL LOW (ref 8.5–10.1)
Chloride: 103 mmol/L (ref 98–107)
Co2: 30 mmol/L (ref 21–32)
Creatinine: 4.06 mg/dL — ABNORMAL HIGH (ref 0.60–1.30)
EGFR (African American): 19 — ABNORMAL LOW
EGFR (Non-African Amer.): 17 — ABNORMAL LOW
Glucose: 85 mg/dL (ref 65–99)
Osmolality: 280 (ref 275–301)
Potassium: 4.3 mmol/L (ref 3.5–5.1)
Sodium: 137 mmol/L (ref 136–145)

## 2013-01-02 LAB — VANCOMYCIN, TROUGH: Vancomycin, Trough: 16 ug/mL (ref 10–20)

## 2013-01-02 LAB — PROTIME-INR: Prothrombin Time: 14.6 secs (ref 11.5–14.7)

## 2013-01-03 LAB — CREATININE, SERUM
Creatinine: 4.05 mg/dL — ABNORMAL HIGH (ref 0.60–1.30)
EGFR (African American): 19 — ABNORMAL LOW

## 2013-01-03 LAB — HEPATIC FUNCTION PANEL A (ARMC)
Albumin: 2 g/dL — ABNORMAL LOW (ref 3.4–5.0)
Bilirubin, Direct: 0.1 mg/dL (ref 0.00–0.20)
Bilirubin,Total: 0.4 mg/dL (ref 0.2–1.0)
SGOT(AST): 21 U/L (ref 15–37)
Total Protein: 6.8 g/dL (ref 6.4–8.2)

## 2013-01-03 LAB — HEMOGLOBIN: HGB: 7.4 g/dL — ABNORMAL LOW (ref 13.0–18.0)

## 2013-01-04 LAB — CREATININE, SERUM
Creatinine: 4.04 mg/dL — ABNORMAL HIGH (ref 0.60–1.30)
EGFR (Non-African Amer.): 17 — ABNORMAL LOW

## 2013-01-05 LAB — RENAL FUNCTION PANEL
Albumin: 2.1 g/dL — ABNORMAL LOW (ref 3.4–5.0)
Calcium, Total: 8.6 mg/dL (ref 8.5–10.1)
Chloride: 101 mmol/L (ref 98–107)
Creatinine: 4.04 mg/dL — ABNORMAL HIGH (ref 0.60–1.30)
EGFR (African American): 19 — ABNORMAL LOW
EGFR (Non-African Amer.): 17 — ABNORMAL LOW
Glucose: 89 mg/dL (ref 65–99)
Osmolality: 281 (ref 275–301)
Phosphorus: 4.9 mg/dL (ref 2.5–4.9)
Sodium: 137 mmol/L (ref 136–145)

## 2013-01-06 LAB — BASIC METABOLIC PANEL
BUN: 32 mg/dL — ABNORMAL HIGH (ref 7–18)
EGFR (African American): 19 — ABNORMAL LOW
EGFR (Non-African Amer.): 17 — ABNORMAL LOW
Osmolality: 280 (ref 275–301)
Potassium: 4.4 mmol/L (ref 3.5–5.1)
Sodium: 137 mmol/L (ref 136–145)

## 2014-02-03 LAB — CBC WITH DIFFERENTIAL/PLATELET
Basophil #: 0.1 10*3/uL (ref 0.0–0.1)
Basophil %: 1 %
EOS ABS: 0.1 10*3/uL (ref 0.0–0.7)
Eosinophil %: 0.9 %
HCT: 47 % (ref 40.0–52.0)
HGB: 14.8 g/dL (ref 13.0–18.0)
LYMPHS PCT: 15.7 %
Lymphocyte #: 1.8 10*3/uL (ref 1.0–3.6)
MCH: 28 pg (ref 26.0–34.0)
MCHC: 31.5 g/dL — AB (ref 32.0–36.0)
MCV: 89 fL (ref 80–100)
Monocyte #: 1.2 x10 3/mm — ABNORMAL HIGH (ref 0.2–1.0)
Monocyte %: 10.4 %
NEUTROS ABS: 8.1 10*3/uL — AB (ref 1.4–6.5)
NEUTROS PCT: 72 %
Platelet: 220 10*3/uL (ref 150–440)
RBC: 5.29 10*6/uL (ref 4.40–5.90)
RDW: 15 % — ABNORMAL HIGH (ref 11.5–14.5)
WBC: 11.2 10*3/uL — ABNORMAL HIGH (ref 3.8–10.6)

## 2014-02-03 LAB — COMPREHENSIVE METABOLIC PANEL
ALT: 43 U/L
Albumin: 3.4 g/dL (ref 3.4–5.0)
Alkaline Phosphatase: 55 U/L
Anion Gap: 4 — ABNORMAL LOW (ref 7–16)
BILIRUBIN TOTAL: 0.5 mg/dL (ref 0.2–1.0)
BUN: 20 mg/dL — AB (ref 7–18)
Calcium, Total: 9 mg/dL (ref 8.5–10.1)
Chloride: 103 mmol/L (ref 98–107)
Co2: 34 mmol/L — ABNORMAL HIGH (ref 21–32)
Creatinine: 1.97 mg/dL — ABNORMAL HIGH (ref 0.60–1.30)
EGFR (Non-African Amer.): 39 — ABNORMAL LOW
GFR CALC AF AMER: 47 — AB
GLUCOSE: 106 mg/dL — AB (ref 65–99)
OSMOLALITY: 284 (ref 275–301)
POTASSIUM: 3.8 mmol/L (ref 3.5–5.1)
SGOT(AST): 54 U/L — ABNORMAL HIGH (ref 15–37)
Sodium: 141 mmol/L (ref 136–145)
TOTAL PROTEIN: 7.5 g/dL (ref 6.4–8.2)

## 2014-02-03 LAB — DIGOXIN LEVEL: DIGOXIN: 0.1 ng/mL

## 2014-02-03 LAB — CK-MB: CK-MB: 4 ng/mL — ABNORMAL HIGH (ref 0.5–3.6)

## 2014-02-03 LAB — TROPONIN I: Troponin-I: 0.65 ng/mL — ABNORMAL HIGH

## 2014-02-04 ENCOUNTER — Inpatient Hospital Stay: Payer: Self-pay | Admitting: Internal Medicine

## 2014-02-04 LAB — DRUG SCREEN, URINE
Amphetamines, Ur Screen: NEGATIVE (ref ?–1000)
BARBITURATES, UR SCREEN: NEGATIVE (ref ?–200)
BENZODIAZEPINE, UR SCRN: NEGATIVE (ref ?–200)
CANNABINOID 50 NG, UR ~~LOC~~: NEGATIVE (ref ?–50)
Cocaine Metabolite,Ur ~~LOC~~: NEGATIVE (ref ?–300)
MDMA (ECSTASY) UR SCREEN: NEGATIVE (ref ?–500)
METHADONE, UR SCREEN: NEGATIVE (ref ?–300)
Opiate, Ur Screen: NEGATIVE (ref ?–300)
PHENCYCLIDINE (PCP) UR S: NEGATIVE (ref ?–25)
Tricyclic, Ur Screen: NEGATIVE (ref ?–1000)

## 2014-02-04 LAB — PROTIME-INR
INR: 1.3
Prothrombin Time: 15.5 secs — ABNORMAL HIGH (ref 11.5–14.7)

## 2014-02-04 LAB — APTT: Activated PTT: 28.7 secs (ref 23.6–35.9)

## 2014-02-04 LAB — HEPARIN LEVEL (UNFRACTIONATED): ANTI-XA(UNFRACTIONATED): 0.34 [IU]/mL (ref 0.30–0.70)

## 2014-02-04 LAB — TROPONIN I
TROPONIN-I: 0.53 ng/mL — AB
Troponin-I: 0.44 ng/mL — ABNORMAL HIGH

## 2014-02-05 LAB — CBC WITH DIFFERENTIAL/PLATELET
Basophil #: 0.1 10*3/uL (ref 0.0–0.1)
Basophil %: 0.8 %
EOS PCT: 2.8 %
Eosinophil #: 0.2 10*3/uL (ref 0.0–0.7)
HCT: 41.2 % (ref 40.0–52.0)
HGB: 13.1 g/dL (ref 13.0–18.0)
Lymphocyte #: 1.1 10*3/uL (ref 1.0–3.6)
Lymphocyte %: 14 %
MCH: 28.6 pg (ref 26.0–34.0)
MCHC: 31.9 g/dL — ABNORMAL LOW (ref 32.0–36.0)
MCV: 90 fL (ref 80–100)
Monocyte #: 0.8 x10 3/mm (ref 0.2–1.0)
Monocyte %: 10.1 %
NEUTROS PCT: 72.3 %
Neutrophil #: 5.7 10*3/uL (ref 1.4–6.5)
PLATELETS: 185 10*3/uL (ref 150–440)
RBC: 4.58 10*6/uL (ref 4.40–5.90)
RDW: 14.8 % — ABNORMAL HIGH (ref 11.5–14.5)
WBC: 7.9 10*3/uL (ref 3.8–10.6)

## 2014-02-05 LAB — BASIC METABOLIC PANEL
ANION GAP: 5 — AB (ref 7–16)
BUN: 16 mg/dL (ref 7–18)
CHLORIDE: 103 mmol/L (ref 98–107)
CO2: 32 mmol/L (ref 21–32)
Calcium, Total: 9 mg/dL (ref 8.5–10.1)
Creatinine: 1.44 mg/dL — ABNORMAL HIGH (ref 0.60–1.30)
EGFR (African American): >60
GFR CALC NON AF AMER: 56 — AB
Glucose: 97 mg/dL (ref 65–99)
OSMOLALITY: 281 (ref 275–301)
Potassium: 3.7 mmol/L (ref 3.5–5.1)
Sodium: 140 mmol/L (ref 136–145)

## 2014-02-05 LAB — HEPARIN LEVEL (UNFRACTIONATED)
ANTI-XA(UNFRACTIONATED): 0.31 [IU]/mL (ref 0.30–0.70)
Anti-Xa(Unfractionated): <0.1 IU/mL — ABNORMAL LOW (ref 0.30–0.70)

## 2014-02-06 LAB — CBC WITH DIFFERENTIAL/PLATELET
Basophil #: 0 10*3/uL (ref 0.0–0.1)
Basophil %: 0.5 %
EOS ABS: 0.2 10*3/uL (ref 0.0–0.7)
Eosinophil %: 2.3 %
HCT: 42.2 % (ref 40.0–52.0)
HGB: 13.7 g/dL (ref 13.0–18.0)
LYMPHS ABS: 1.2 10*3/uL (ref 1.0–3.6)
LYMPHS PCT: 14 %
MCH: 28.9 pg (ref 26.0–34.0)
MCHC: 32.4 g/dL (ref 32.0–36.0)
MCV: 89 fL (ref 80–100)
MONO ABS: 0.9 x10 3/mm (ref 0.2–1.0)
MONOS PCT: 10.7 %
Neutrophil #: 6.3 10*3/uL (ref 1.4–6.5)
Neutrophil %: 72.5 %
Platelet: 222 10*3/uL (ref 150–440)
RBC: 4.74 10*6/uL (ref 4.40–5.90)
RDW: 15.1 % — AB (ref 11.5–14.5)
WBC: 8.8 10*3/uL (ref 3.8–10.6)

## 2014-02-06 LAB — HEPARIN LEVEL (UNFRACTIONATED): ANTI-XA(UNFRACTIONATED): 0.2 [IU]/mL — AB (ref 0.30–0.70)

## 2014-02-06 LAB — PROTIME-INR
INR: 1.1
PROTHROMBIN TIME: 13.8 s (ref 11.5–14.7)

## 2014-03-28 ENCOUNTER — Inpatient Hospital Stay: Payer: Self-pay | Admitting: Internal Medicine

## 2014-04-08 ENCOUNTER — Emergency Department: Payer: Self-pay | Admitting: Emergency Medicine

## 2014-04-08 LAB — BASIC METABOLIC PANEL
Anion Gap: 6 — ABNORMAL LOW (ref 7–16)
BUN: 18 mg/dL
CALCIUM: 9 mg/dL
CREATININE: 1.7 mg/dL — AB
Chloride: 99 mmol/L — ABNORMAL LOW
Co2: 31 mmol/L
EGFR (African American): 54 — ABNORMAL LOW
GFR CALC NON AF AMER: 47 — AB
Glucose: 139 mg/dL — ABNORMAL HIGH
Potassium: 3.6 mmol/L
Sodium: 136 mmol/L

## 2014-04-08 LAB — PRO B NATRIURETIC PEPTIDE: B-Type Natriuretic Peptide: 364 pg/mL — ABNORMAL HIGH

## 2014-04-09 ENCOUNTER — Emergency Department: Payer: Self-pay | Admitting: Emergency Medicine

## 2014-04-16 ENCOUNTER — Emergency Department
Admit: 2014-04-16 | Disposition: A | Discharge status: Home / Self Care | Payer: Self-pay | Admitting: Emergency Medicine

## 2014-04-16 LAB — CBC WITH DIFFERENTIAL/PLATELET
Basophil #: 0.1 10*3/uL (ref 0.0–0.1)
Basophil %: 0.9 %
EOS ABS: 0.1 10*3/uL (ref 0.0–0.7)
Eosinophil %: 1.5 %
HCT: 44.2 % (ref 40.0–52.0)
HGB: 14 g/dL (ref 13.0–18.0)
Lymphocyte #: 1.3 10*3/uL (ref 1.0–3.6)
Lymphocyte %: 12.9 %
MCH: 26.7 pg (ref 26.0–34.0)
MCHC: 31.6 g/dL — AB (ref 32.0–36.0)
MCV: 84 fL (ref 80–100)
MONO ABS: 0.9 x10 3/mm (ref 0.2–1.0)
Monocyte %: 9.1 %
NEUTROS ABS: 7.5 10*3/uL — AB (ref 1.4–6.5)
NEUTROS PCT: 75.6 %
Platelet: 326 10*3/uL (ref 150–440)
RBC: 5.23 10*6/uL (ref 4.40–5.90)
RDW: 16.4 % — ABNORMAL HIGH (ref 11.5–14.5)
WBC: 10 10*3/uL (ref 3.8–10.6)

## 2014-04-16 LAB — COMPREHENSIVE METABOLIC PANEL
ALK PHOS: 51 U/L
AST: 23 U/L
Albumin: 3.6 g/dL
Anion Gap: 8 (ref 7–16)
BUN: 19 mg/dL
Bilirubin,Total: 0.6 mg/dL
Calcium, Total: 9.3 mg/dL
Chloride: 100 mmol/L — ABNORMAL LOW
Co2: 32 mmol/L
Creatinine: 1.68 mg/dL — ABNORMAL HIGH
EGFR (Non-African Amer.): 48 — ABNORMAL LOW
GFR CALC AF AMER: 55 — AB
GLUCOSE: 130 mg/dL — AB
Potassium: 3.5 mmol/L
SGPT (ALT): 12 U/L — ABNORMAL LOW
Sodium: 140 mmol/L
Total Protein: 7.5 g/dL

## 2014-04-21 LAB — CULTURE, BLOOD (SINGLE)

## 2014-04-22 ENCOUNTER — Inpatient Hospital Stay
Admit: 2014-04-22 | Disposition: A | Discharge status: Home / Self Care | Payer: Self-pay | Attending: Internal Medicine | Admitting: Internal Medicine

## 2014-04-22 LAB — COMPREHENSIVE METABOLIC PANEL
ALT: 14 U/L — AB
AST: 21 U/L
Albumin: 3.5 g/dL
Alkaline Phosphatase: 53 U/L
Anion Gap: 10 (ref 7–16)
BUN: 28 mg/dL — AB
Bilirubin,Total: 0.6 mg/dL
Calcium, Total: 9.6 mg/dL
Chloride: 95 mmol/L — ABNORMAL LOW
Co2: 32 mmol/L
Creatinine: 1.83 mg/dL — ABNORMAL HIGH
EGFR (African American): 50 — ABNORMAL LOW
GFR CALC NON AF AMER: 43 — AB
Glucose: 130 mg/dL — ABNORMAL HIGH
Potassium: 3.9 mmol/L
SODIUM: 137 mmol/L
TOTAL PROTEIN: 7.6 g/dL

## 2014-04-22 LAB — URINALYSIS, COMPLETE
BILIRUBIN, UR: NEGATIVE
Bacteria: NONE SEEN
Glucose,UR: NEGATIVE mg/dL (ref 0–75)
Ketone: NEGATIVE
LEUKOCYTE ESTERASE: NEGATIVE
NITRITE: NEGATIVE
Ph: 5 (ref 4.5–8.0)
Protein: 30
RBC,UR: 1 /HPF (ref 0–5)
Specific Gravity: 1.02 (ref 1.003–1.030)
Squamous Epithelial: <1
WBC UR: 3 /HPF (ref 0–5)

## 2014-04-22 LAB — CBC WITH DIFFERENTIAL/PLATELET
Basophil #: 0.1 10*3/uL (ref 0.0–0.1)
Basophil %: 1.1 %
EOS PCT: 0.9 %
Eosinophil #: 0.1 10*3/uL (ref 0.0–0.7)
HCT: 44.6 % (ref 40.0–52.0)
HGB: 14.2 g/dL (ref 13.0–18.0)
Lymphocyte #: 1.2 10*3/uL (ref 1.0–3.6)
Lymphocyte %: 12 %
MCH: 26.6 pg (ref 26.0–34.0)
MCHC: 31.8 g/dL — ABNORMAL LOW (ref 32.0–36.0)
MCV: 84 fL (ref 80–100)
MONOS PCT: 7.5 %
Monocyte #: 0.7 x10 3/mm (ref 0.2–1.0)
NEUTROS ABS: 7.7 10*3/uL — AB (ref 1.4–6.5)
Neutrophil %: 78.5 %
PLATELETS: 317 10*3/uL (ref 150–440)
RBC: 5.33 10*6/uL (ref 4.40–5.90)
RDW: 16.3 % — AB (ref 11.5–14.5)
WBC: 9.8 10*3/uL (ref 3.8–10.6)

## 2014-04-22 LAB — TROPONIN I: Troponin-I: <0.03 ng/mL

## 2014-04-22 LAB — CK TOTAL AND CKMB (NOT AT ARMC)
CK, TOTAL: 85 U/L
CK-MB: 4.8 ng/mL

## 2014-04-22 LAB — LIPASE, BLOOD: LIPASE: 48 U/L

## 2014-04-23 LAB — BASIC METABOLIC PANEL
Anion Gap: 6 — ABNORMAL LOW (ref 7–16)
BUN: 26 mg/dL — ABNORMAL HIGH
CALCIUM: 8.6 mg/dL — AB
CREATININE: 1.67 mg/dL — AB
Chloride: 99 mmol/L — ABNORMAL LOW
Co2: 34 mmol/L — ABNORMAL HIGH
EGFR (African American): 56 — ABNORMAL LOW
GFR CALC NON AF AMER: 48 — AB
Glucose: 124 mg/dL — ABNORMAL HIGH
Potassium: 3.7 mmol/L
Sodium: 139 mmol/L

## 2014-04-23 LAB — MAGNESIUM: Magnesium: 2 mg/dL

## 2014-04-24 LAB — BASIC METABOLIC PANEL
Anion Gap: 7 (ref 7–16)
BUN: 20 mg/dL
CREATININE: 1.3 mg/dL — AB
Calcium, Total: 8.5 mg/dL — ABNORMAL LOW
Chloride: 95 mmol/L — ABNORMAL LOW
Co2: 34 mmol/L — ABNORMAL HIGH
EGFR (African American): >60
EGFR (Non-African Amer.): >60
Glucose: 105 mg/dL — ABNORMAL HIGH
POTASSIUM: 3.5 mmol/L
Sodium: 136 mmol/L

## 2014-04-25 LAB — BASIC METABOLIC PANEL
ANION GAP: 3 — AB (ref 7–16)
BUN: 18 mg/dL
CALCIUM: 8.8 mg/dL — AB
CHLORIDE: 97 mmol/L — AB
Co2: 39 mmol/L — ABNORMAL HIGH
Creatinine: 1.29 mg/dL — ABNORMAL HIGH
EGFR (African American): >60
Glucose: 120 mg/dL — ABNORMAL HIGH
Potassium: 4.2 mmol/L
Sodium: 139 mmol/L

## 2014-04-25 LAB — MAGNESIUM: Magnesium: 1.9 mg/dL

## 2014-05-07 NOTE — Consult Note (Signed)
PATIENT NAME:  Gerald Roth, Kendrik L MR#:  [id number] DATE OF BIRTH:  May 14, 1965  DATE OF ADMISSION: 12/20/2012  DATE OF CONSULTATION:  12/22/2012  REFERRING PHYSICIAN:  Milagros LollSrikar Sudini, MD CONSULTING PROVIDER:  Keturah Barrehristiane H. London, NP  REASON FOR CONSULTATION: GI consult was ordered by Dr. Elpidio AnisSudini to evaluate for bright red bleeding per rectum. The patient was on Coumadin.    HISTORY OF PRESENT ILLNESS: I appreciate consult for this 49 year old man with history of diabetes, PVD, obesity, atrial fibrillation on Coumadin therapy, admitted with right lower extremity cellulitis for onset of rectal bleeding today. Evidently this morning at 6:00, he had a dark red stool with some clots. He repeated the same type of stool approximately 2 hours later. States he has not had any abdominal pain, nausea or vomiting problems, swallowing, or any other GI-related complaint. He did have a GI bleeding scan today and there is some discussion of an active bleed questionably in the duodenal area. His INR this morning was 2.5. He has received FFP today. He has been started on a pantoprazole drip. He has not had any further bleeding since 8:00 this morning.   PAST MEDICAL HISTORY: Coronary artery disease status post MI, cardiomyopathy, congestive heart failure with ejection fraction of 25%, chronic atrial fibrillation, Coumadin for anticoagulation, obstructive sleep apnea, nonadherent to CPAP, morbid obesity, hypertension, hyperlipidemia, chronic lower extremity edema more on left than right, tonsillectomy, uvulopalatopharyngoplasty for sleep apnea.   ALLERGIES: BEE STINGS.   PSYCHOSOCIAL HISTORY: Lives with friends. No smoking. Occasional EtOH. Denies illicits. 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   FAMILY HISTORY: Significant for ovarian cancer, diabetes.   HOME MEDICATIONS: Amiodarone 100 mg p.o. twice a day, Coumadin 1 mg alternating with 10 mg once a day, digoxin 125 mcg one tab once a day, Lasix 40, one tab  p.o. b.i.d., lisinopril 5 mg once a day, magnesium oxide 1 p.o. b.i.d., metoprolol 100 mg p.o. b.i.d., omeprazole 20 mg p.o. daily.   REVIEW OF SYSTEMS: Significant for bilateral lower extremity swelling, right leg erythema, drainage. Wound currently receiving antibiotics for this. Has had rapid supraventricular tachycardia likely relating to his atrial fibrillation and is on diltiazem drip. Has been seen by cardiology this visit. Feels warm in room; otherwise, systems review is unremarkable.   LABORATORY DATA: Most recent: Glucose 100 BUN 10, creatinine 0.82, sodium 135, potassium 3.3, chloride 98, GFR greater than 60, calcium 8.5. A1c 6.2. Total protein 8.6, albumin 2.4, total bilirubin 0.6, ALP 93, AST 51, ALT 28, TSH 2.07. WBC 12.8, hemoglobin 8.2, platelet count 231. Red cells significant for signs of anemia of chronic disease. PT 26.2, INR 2.5.   GI bleeding scan as noted above. Concern was noted for bleeding somewhere in the duodenum.   PHYSICAL EXAMINATION:  GENERAL: Morbidly obese, comfortable-appearing man in no acute distress.  HEENT: Normocephalic, atraumatic. Sclerae clear. Mucous membranes pink and moist.  NECK: Supple. No JVD or thyromegaly.  LUNGS: Respirations eupneic. Lungs clear bilaterally. Able to speak in complete sentences. No respiratory distress.  CARDIAC: S1 and S2. Atrial fibrillation on the monitor. Rhythm is somewhat irregular. Does have significant lower extremity edema and alligator appearance-type skin down on both shins. Peripheral pulses are palpable 2+.  ABDOMEN: Obese abdomen. Bowel sounds x4. Soft, nondistended, nontender. No hepatosplenomegaly. No masses. No guarding, rigidity or peritoneal signs.  RECTAL: A few external hemorrhoids. Stool is a dark maroon. There is no tenderness.  EXTREMITIES: MAEW x4. Strength five out of five as noted in  both lower extremities with significant edema and alligator-appearing type skin to the shins. The right lower extremity is  reddened, has a yellowish drainage, and several small ulcerations.  PSYCHIATRIC: Pleasant, calm, cooperative. Mood stable.  NEUROLOGICAL: Alert, oriented x3. Cranial nerves II through XII intact.   IMPRESSION AND PLAN: Rectal bleeding. Had gastrointestinal bleeding scan, has shown some bleeding, possibly from the duodenum. Noted that he has a normal BUN. He has not had any further bleeding since approximately 8:00 or 8:30 this morning. He has been evaluated by vascular. He has received correction for his anticoagulation. He has been started on a proton pump inhibitor drip. I have discussed this patient with both Dr. Marva Panda and Dr. Wyn Quaker. Presently we will observe. Reversing his anticoagulation and starting proton pump inhibitor may be enough to treat him for this. We will go ahead and recommend transfusing 1 unit of packed red blood cells presently following serial hemoglobins and will possibly intervene with esophagogastroduodenoscopy as clinically feasible.   Thank you very much for this consult. These services were provided by Vevelyn Pat, MSN, [facility], in collaboration with Barnetta Chapel, MD, with whom I have discussed this patient in full.   ____________________________ Keturah Barre, NP chl:np D: 12/22/2012 17:41:19 ET T: 12/22/2012 18:06:23 ET JOB#: [id number]  cc: Keturah Barre, NP, <Dictator> Eustaquio Maize LONDON FNP ELECTRONICALLY SIGNED 12/24/2012 11:04

## 2014-05-07 NOTE — Consult Note (Signed)
Hgb stable today.  No further bleeding.   72 hours of PPI drip , then protonix 40 mg BID for 8 weeks, then 40 mg dailyf/u h pylori serologyno more NSAIDS  Electronic Signatures: Dow Adolphein, Oracio Galen (MD)  (Signed on 12-Dec-14 18:15)  Authored  Last Updated: 12-Dec-14 18:15 by Dow Adolphein, Euell Schiff (MD)

## 2014-05-07 NOTE — Discharge Summary (Signed)
PATIENT NAME:  Gerald Roth, [name] MR#:  [id number] DATE OF BIRTH:  Jun 17, 1965  DATE OF ADMISSION:  12/20/2012 DATE OF DISCHARGE:  01/06/2013  PRIMARY CARE PHYSICIAN:  Nonlocal.    ADDENDUM: For detailed discharge summary, please refer to the discharge summary dictated by Dr. Sherryll BurgerShah 3 days ago.   FINAL DIAGNOSES:  1.  Severe acute blood loss anemia due to duodenal ulcer.  2.  Duodenitis with gastrointestinal bleeding.  3.  Chronic atrial fibrillation with rapid ventricular response due to sepsis and gastrointestinal bleeding.  4.  Acute renal failure due to acute tubular necrosis. Interstitial nephritis. 5.  Right lower extremity cellulitis with sepsis. 6.  POA due to lymphedema.  7.  Methicillin-resistant Staphylococcus aureus in wound.   8.  Peripheral vascular disease.  9.  Chronic venous stasis.  10.  Coronary artery disease.  11.  Chronic systolic congestive heart failure.  12.  Obstructive sleep apnea.  13.  Morbid obesity.   CONDITION: Stable.   CODE STATUS: Full code.   PROCEDURE: EGD, status post cauterization.   HOME MEDICATIONS:  1.  Amiodarone 100 mg p.o. b.i.d. 2.  Digoxin 125 mcg p.o. once a day.  3.  Lopressor 100 mg p.o. b.i.d. 4.  Diltiazem 300 mg per 24 hours oral capsule extended-release 1 cap once a day.  5.  Sucralfate 1 gram per 10 mL oral suspension, 10 mL t.i.d. before meals.  6.  Protonix 40 mg p.o. b.i.d.  7.  The patient's Coumadin is on hold due to GI bleeding and anemia.   The patient needs home health and wound care.  DIET:  Low-sodium, low-fat, low-cholesterol diet.   ACTIVITY: As tolerated.   FOLLOWUP CARE:  1.  Follow with PCP within 1 to 2 weeks.  2.  Follow up with Dr. Cherylann RatelLateef within 1 week.  3.  Follow up Dr. Marva PandaSkulskie within 1 to 2 weeks.   HOSPITAL COURSE: 1.  Severe acute blood loss anemia due to duodenal ulcer. The patient is status post catheterization.  Hemoglobin has been stable. The patient has no melena or bloody stool, no  acute bleeding. Last hemoglobin is 7.4.  2.  Chronic A. fib with RVR due to sepsis and GI bleeding. The patient's heart rate is under control. Patient's Coumadin has been on hold in spite of CVA risk due to severe GI bleeding. The patient will continue Lopressor, Cardizem p.o. and also continue amiodarone.  3.  Acute renal failure due to ATN.  According to Dr. Cherylann RatelLateef, the patient's renal biopsy report came back just now which showed interstitial nephritis. Dr. Cherylann RatelLateef suggested the patient follow up BMP and follow up with him as outpatient within 1 week.  4.  Hypotension has improved.  5.  Right lower extremity cellulitis with sepsis due to lymphedema and MRSA in wound.  The patient's cellulitis has much improved. The patient is on dressing, wound care. The patient was on vancomycin which was discontinued,  6.  Chronic systolic CHF has been stable.   The patient has no complaints. Vital signs are stable. Physical examination is unremarkable. The patient is clinically stable, will be discharged to home with home health today. I discussed the patient's discharge plan with the patient, nurse, case manager and Dr. Cherylann RatelLateef.   TIME SPENT: About 38 minutes.   ____________________________ Shaune PollackQing Jeslynn Hollander, MD qc:cs D: 01/06/2013 16:25:13 ET T: 01/06/2013 18:28:55 ET JOB#: [id number]  cc: Shaune PollackQing Jodel Mayhall, MD, <Dictator> Shaune PollackQING Stevie Charter MD ELECTRONICALLY SIGNED 01/07/2013 16:54

## 2014-05-07 NOTE — Consult Note (Signed)
Chief Complaint:  Subjective/Chief Complaint Please see full GI consult and brief consult note.  49 yo male admitted 12/6 with rle cellulitis.  Patietn with episode of hematochezia early this am, bright red in nature.   Repeat 3 episodes this afternoon.  DRE to my exam this evening shows red to light marron, thin.  Patietn does have a history of taking aleve 2 a day for the last 2 weeks.  Denies nausea or abdominal pain.  Patietn theraputic on coumadin this am (INR 2.5), has been given ffp and vit K to help reverse the coagulopathy, 2 unit prbc still finishing transfusing.  No repeat hgb since this pm, 8.2.  Will recheck cbc, pt and electrolytes once tfx is completed.  Will keep 2 unit prbc ahead for use, may need further ffp, EGD when clinically feasible.  I have discussed the risks benefits and complicatiosn of egd to include not limited to bleeding infection perforation and sedation and he wishes to proceed.  Patietn with H/o CM with CAD/MI, 25% ef, AF OSA, morbid obesity, HTN, hyperlipidemia, UPPP, chronic LEE.  Following.   Electronic Signatures: Barnetta ChapelSkulskie, Martin (MD)  (Signed 08-Dec-14 20:12)  Authored: Chief Complaint   Last Updated: 08-Dec-14 20:12 by Barnetta ChapelSkulskie, Martin (MD)

## 2014-05-07 NOTE — Consult Note (Signed)
   General Aspect CC:  "I haven't had any problem with chest pain or my atrial fibrillation. I came to the emergency room for my leg."   Present Illness This is a 49 year old morbidly obese white male with history of chronic atrial fibrillation, for which he has been anticoagulated with the use of Coumadin. He also has known CAD, status post MI in the past, cardiomyopathy with ejection fraction 25%, sleep apnea, hypertension, hyperlipidemia, and renal failure. He currently denies any recent episodes of chest pain, worsening dyspnea on exertion, or palpitations.  He states that over the past several days, he has had an oozing open right lower extremity wound for which he was concerned about infection, and he presented to the emergency department.  Admission EKG did show atrial fibrillation with rapid ventricular rate, and the patient is currently receiving Cardizem 20 mg per hour intravenously, with heart rate 100-110 bpm. Most recent INR is therapeutic at 2.5. The nursing staff reports recent dark or mahogany-colored stools, with a drop of in his hemoglobin from 13 to 9.2., and the patient is being taken to nuclear medicine for a bleeding study.   Physical Exam:  GEN no acute distress   HEENT PERRL, hearing intact to voice, moist oral mucosa   NECK supple  No masses   RESP normal resp effort  clear BS   CARD Irregular rate and rhythm  Normal, S1, S2   ABD denies tenderness  soft  normal BS   EXTR positive cyanosis/clubbing, positive edema, bilateral lower extremity   SKIN positive ulcers, right lower extremity oozing   NEURO cranial nerves intact   PSYCH alert, A+O to time, place, person   Review of Systems:  General: No Complaints   Skin: No Complaints   ENT: No Complaints   Eyes: No Complaints   Neck: No Complaints   Respiratory: No Complaints   Cardiovascular: No Complaints   Gastrointestinal: Black tarry stools   Genitourinary: No Complaints   Vascular: bilateral  lower extremity lymphedema with oozing ulceration  of RLE   Musculoskeletal: No Complaints   Neurologic: No Complaints   Hematologic: No Complaints   Endocrine: No Complaints   Psychiatric: No Complaints   Review of Systems: All other systems were reviewed and found to be negative   EKG:  Interpretation atrial fibrillation with rapid Ventricular rate   Rate 105    Bee Stings: Other   Impression 49 year old white male with chronic atrial fibrillation on anti-coagulation with Coumadin, known CAD, cardiomyopathy with ejection fraction 25%, sleep apnea, hypertension, hyperlipidemia, and renal failure, with possible active GI bleeding   Plan Continue beta blockers, and digoxin, calcium channel blocker and amiodarone for heart rate control. Continue ACE inhibitor for hypertension control as well as cardiomyopathy, with continued close monitoring of renal function. Discontinue anticoagulation at this time, due to active GI bleeding.   Electronic Signatures: Olin PiaMelville, Bonnie J (NP)  (Signed 08-Dec-14 17:16)  Authored: General Aspect/Present Illness, History and Physical Exam, Review of System, EKG , Allergies, Impression/Plan   Last Updated: 08-Dec-14 17:16 by Olin PiaMelville, Bonnie J (NP)

## 2014-05-07 NOTE — Consult Note (Signed)
Present Illness The patient is a morbidly obese 49 year old male with a history of CAD, status post MI and cardiomyopathy, congestive heart failure, systolic CHF, EF of 44%, chronic A. fib on anticoagulation. He states that he has severe swelling as a baseline much worse on the left and it has been likel this for years.   As the swelling got worse and he had lower extremity pain in the left leg, he came to the ER. He was found to have significantly elevated heart rate up to 180s. He is in A. fib. He also had mild leukocytosis. He was given diltiazem IV 10 mg x 2 and was started on diltiazem drip. He initially presented with hypotensive, initial blood pressure of 87/63 and initial heart rate of 174. The patient has no chest pains of note. In the ER he was noted to have profound erythema fo teh left leg and he was initiated on antibiotics.  I am asked to evaluate for pooible treatemtn of his chronic lymphedema.  PAST MEDICAL HISTORY:  1.  History of CAD, status post MI. 2.  Cardiomyopathy with congestive heart failure, EF of 25% per chart.  3.  Chronic A. fib, on Eliquis for anticoagulation.  4.  Obstructive sleep apnea, noncompliant with CPAP.  5.  Morbid obesity.  6.  Hypertension.  7.  Hyperlipidemia  8.  Chronic lower extremity edema, more on the left than the right   Home Medications: Medication Instructions Status  Lasix 40 mg oral tablet 1 tab(s) orally once a day Active  Mag-Ox 400 oral tablet 1 tab(s) orally 2 times a day Active  Protonix 40 mg oral enteric coated tablet 1 tab(s) orally once a day Active  lisinopril 5 mg oral tablet 1 tab(s) orally once a day Active  metoprolol 25 mg oral tablet 1 tab(s) orally 2 times a day Active  diltiazem 300 mg/24 hours oral capsule, extended release 1 cap(s) orally once a day Active  Eliquis 5 mg oral tablet 1 tab(s) orally 2 times a day Active    Bee Stings: Other  Case History:  Family History Non-Contributory   Social History positive  tobacco (Greater than 1 year), positive ETOH, negative Illicit drugs   Review of Systems:  Fever/Chills Yes   Cough No   Sputum No   Abdominal Pain No   Diarrhea No   Constipation No   Nausea/Vomiting No   SOB/DOE No   Chest Pain No   Telemetry Reviewed Afib   Dysuria No   Physical Exam:  GEN well developed, obese   HEENT hearing intact to voice, poor dentition   NECK supple  trachea midline   RESP normal resp effort  no use of accessory muscles   CARD irregular rate  no JVD   ABD denies tenderness  obese nondistended   EXTR positive edema, massive edema bilaterally left >right,  severe chronic skin changes noted, erythema still present on the left   SKIN positive rashes, skin turgor poor   NEURO follows commands, motor/sensory function intact   PSYCH alert, good insight   Nursing/Ancillary Notes: **Vital Signs.:   27-Aug-14 08:01  Vital Signs Type Routine  Temperature Temperature (F) 98.4  Celsius 36.8  Temperature Source oral  Pulse Pulse 104  Respirations Respirations 20  Systolic BP Systolic BP 967  Diastolic BP (mmHg) Diastolic BP (mmHg) 91  Mean BP 100  Pulse Ox % Pulse Ox % 96  Pulse Ox Activity Level  At rest  Oxygen Delivery 2L  Routine Chem:  25-Aug-14 04:49   Glucose, Serum 90  BUN  19  Creatinine (comp) 1.03  Sodium, Serum  131  Potassium, Serum 3.7  Chloride, Serum  94  CO2, Serum  33  Calcium (Total), Serum 9.3  Anion Gap  4  Osmolality (calc) 264  eGFR (African American) >60  eGFR (Non-African American) >60 (eGFR values <31m/min/1.73 m2 may be an indication of chronic kidney disease (CKD). Calculated eGFR is useful in patients with stable renal function. The eGFR calculation will not be reliable in acutely ill patients when serum creatinine is changing rapidly. It is not useful in  patients on dialysis. The eGFR calculation may not be applicable to patients at the low and high extremes of body sizes, pregnant women,  and vegetarians.)  27-Aug-14 04:49   Result Comment APTT - RESULTS VERIFIED BY REPEAT TESTING.  - NOTIFIED OF CRITICAL VALUE  - C/ DALE HOPKINS _0  09-10-12 BY [facility]  - READ-BACK PROCESS PERFORMED.  Result(s) reported on 10 Sep 2012 at 05:39AM.  Result Comment CO2 - NOTIFIED OF CRITICAL VALUE  - RESULTS VERIFIED BY REPEAT TESTING.  - CALLED TO DEL HOPKINS:09/10/12 _1 .  - READ-BACK PROCESS PERFORMED.  - TPL  Result(s) reported on 10 Sep 2012 at 05:22AM.  Glucose, Serum 97  BUN 15  Creatinine (comp) 0.88  Sodium, Serum  133  Potassium, Serum  3.4  Chloride, Serum  89  CO2, Serum  40  Calcium (Total), Serum 8.8  Anion Gap  4  Osmolality (calc) 267  eGFR (African American) >60  eGFR (Non-African American) >60 (eGFR values <615mmin/1.73 m2 may be an indication of chronic kidney disease (CKD). Calculated eGFR is useful in patients with stable renal function. The eGFR calculation will not be reliable in acutely ill patients when serum creatinine is changing rapidly. It is not useful in  patients on dialysis. The eGFR calculation may not be applicable to patients at the low and high extremes of body sizes, pregnant women, and vegetarians.)  Magnesium, Serum 1.9 (1.8-2.4 THERAPEUTIC RANGE: 4-7 mg/dL TOXIC: > 10 mg/dL  -----------------------)  Routine Coag:  25-Aug-14 04:49   Activated PTT (APTT)  61.4 (A HCT value >55% may artifactually increase the APTT. In one study, the increase was an average of 19%. Reference: "Effect on Routine and Special Coagulation Testing Values of Citrate Anticoagulant Adjustment in Patients with High HCT Values." American Journal of Clinical Pathology 2006;126:400-405.)  Prothrombin  20.5  INR 1.8 (INR reference interval applies to patients on anticoagulant therapy. A single INR therapeutic range for coumarins is not optimal for all indications; however, the suggested range for most indications is 2.0 - 3.0. Exceptions to the INR Reference Range  may include: Prosthetic heart valves, acute myocardial infarction, prevention of myocardial infarction, and combinations of aspirin and anticoagulant. The need for a higher or lower target INR must be assessed individually. Reference: The Pharmacology and Management of the Vitamin K  antagonists: the seventh ACCP Conference on Antithrombotic and Thrombolytic Therapy. ChNIOEV.0350ept:126 (3suppl): [id number] HCT value >55% may artifactually increase the PT.  In one study,  the increase was an average of 25%. Reference:  "Effect on Routine and Special Coagulation Testing Values of Citrate Anticoagulant Adjustment in Patients with High HCT Values." American Journal of Clinical Pathology 2006;126:400-405.)    13:05   Activated PTT (APTT)  51.9 (A HCT value >55% may artifactually increase the APTT. In one study, the increase was an average of 19%. Reference: "Effect on Routine and Special Coagulation  Testing Values of Citrate Anticoagulant Adjustment in Patients with High HCT Values." American Journal of Clinical Pathology 2947;654:650-354.)    20:12   Activated PTT (APTT)  55.9 (A HCT value >55% may artifactually increase the APTT. In one study, the increase was an average of 19%. Reference: "Effect on Routine and Special Coagulation Testing Values of Citrate Anticoagulant Adjustment in Patients with High HCT Values." American Journal of Clinical Pathology 2006;126:400-405.)  27-Aug-14 04:49   Activated PTT (APTT)  > 160.0 (A HCT value >55% may artifactually increase the APTT. In one study, the increase was an average of 19%. Reference: "Effect on Routine and Special Coagulation Testing Values of Citrate Anticoagulant Adjustment in Patients with High HCT Values." American Journal of Clinical Pathology 2006;126:400-405.)  Prothrombin  20.9  INR 1.8 (INR reference interval applies to patients on anticoagulant therapy. A single INR therapeutic range for coumarins is not optimal for  all indications; however, the suggested range for most indications is 2.0 - 3.0. Exceptions to the INR Reference Range may include: Prosthetic heart valves, acute myocardial infarction, prevention of myocardial infarction, and combinations of aspirin and anticoagulant. The need for a higher or lower target INR must be assessed individually. Reference: The Pharmacology and Management of the Vitamin K  antagonists: the seventh ACCP Conference on Antithrombotic and Thrombolytic Therapy. SFKCL.2751 Sept:126 (3suppl): [id number]. A HCT value >55% may artifactually increase the PT.  In one study,  the increase was an average of 25%. Reference:  "Effect on Routine and Special Coagulation Testing Values of Citrate Anticoagulant Adjustment in Patients with High HCT Values." American Journal of Clinical Pathology 7001;749:449-675.)    13:44   Activated PTT (APTT)  135.5 (A HCT value >55% may artifactually increase the APTT. In one study, the increase was an average of 19%. Reference: "Effect on Routine and Special Coagulation Testing Values of Citrate Anticoagulant Adjustment in Patients with High HCT Values." American Journal of Clinical Pathology 2006;126:400-405.)  Routine Hem:  25-Aug-14 04:49   Hemoglobin (CBC) 14.2 (Result(s) reported on 08 Sep 2012 at 05:14AM.)  Platelet Count (CBC) 215 (Result(s) reported on 08 Sep 2012 at 05:14AM.)  27-Aug-14 04:49   WBC (CBC) 8.7  RBC (CBC) 4.45  Hemoglobin (CBC)  12.5  Hematocrit (CBC)  37.6  Platelet Count (CBC) 286  MCV 85  MCH 28.1  MCHC 33.3  RDW  15.8  Neutrophil % 76.0  Lymphocyte % 11.7  Monocyte % 8.8  Eosinophil % 2.5  Basophil % 1.0  Neutrophil #  6.6  Lymphocyte # 1.0  Monocyte # 0.8  Eosinophil # 0.2  Basophil # 0.1 (Result(s) reported on 10 Sep 2012 at 05:22AM.)   [place]:    20-Aug-14 19:20, [place] Color Flow Doppler Low Extrem Left (Leg)  [place] Color Flow Doppler Low Extrem Left (Leg)   REASON FOR EXAM:    ro DVT, swelling  pain  COMMENTS:       PROCEDURE: [place]  - US DOPPLER LOW EXTR LEFT  - Sep 03 2012  7:20PM     RESULT: Technique: Pearline Cables scale, Duplex color  flow doppler, and spectral   waveform imaging was performed of the deep venous structures of the LEFT   lower extremity. This study is markedly limited secondary to patient's   morbid obesity of 420+ pounds.    Findings: There is no evidence of increased echogenicity,   non-compressibility, abnormal waveform, abnormal grayscale, nor abnormal   color flow with in the common femoral  and popliteal veins of the LEFT  lower extremity. Due to patient's body habitus, morbid obesity of 420   pounds plus the evaluation of the superficial femoral vein is limited and     can only exclude nonocclusive thrombus. There is appropriate response to   Valsalva and augmentation within the common femoral and popliteal veins.    IMPRESSION:     1. No sonographic evidence of a deep venous thrombus within the common   femoral and poplitealveins. There is no sonographic evidence of   occlusive thrombus within superficial femoral vein. The study is limited   and degraded as described above. If there is clinical concern of deep   venous thrombus treatment with anticoagulant therapy is recommended.        Verified By: Mikki Santee, M.D., MD    Impression 1.  Lymphedema          continue elevation while in hospital           patient will need compression wrapps I suspect [place] wraps would be better for him that stockings          I will need to discuss the possibility of a lymph pump with cardiology given his decreased EF.  2. LLE cellulitis - cont. IV Ancef and follow cultures.  - slow to improve given morbid obesity.  Doppler of LLE (-) for DVT  3. Acute Renal Failure - likely due to cellulitis/uncontrolled a. fib.  - improved since yesterday and will monitor.    4. Elevated Troponin - likely in the setting of uncontrolled a. fib, demand ischemia.  - will  cycle markers.  Pending 2-D echo and will follow up.   5. Leukocytosis - likely due to # 2 and will follow w/ abx therapy.   6. OSA - cont. CPAP.  7.  Uncontrolled a. fib - hx of chronic a. fib. Ran out of meds about a day or so ago.  - rates still uncontrolled this a.m. cont. Cardizem gtt,  - cont. oral Cardizem, increase metoprolol to help wean off cardizem gtt.  - await further cardiology input.  - cont. heparin gtt.  Pt. was on Eliquis but has no insurance and [facility].   Plan level 3 consult   Electronic Signatures: Hortencia Pilar (MD)  (Signed 27-Aug-14 18:52)  Authored: General Aspect/Present Illness, Home Medications, Allergies, History and Physical Exam, Vital Signs, Labs, Radiology, Impression/Plan   Last Updated: 27-Aug-14 18:52 by Hortencia Pilar (MD)

## 2014-05-07 NOTE — Consult Note (Signed)
Brief Consult Note: Diagnosis: lymphedema, cellulitis, ulceration, acute renal failure.   Patient was seen by consultant.   Recommend further assessment or treatment.   Comments: when patient begins sitting up will need an Unna boot on the right.  Electronic Signatures: Levora DredgeSchnier, Gregory (MD)  (Signed 09-Dec-14 18:01)  Authored: Brief Consult Note   Last Updated: 09-Dec-14 18:01 by Levora DredgeSchnier, Gregory (MD)

## 2014-05-07 NOTE — H&P (Signed)
PATIENT NAME:  Gerald Roth, Gerald L MR#:  [id number] DATE OF BIRTH:  [birth year]  DATE OF ADMISSION:  12/20/2012  REFERRING PHYSICIAN: Dr. York CeriseForbach  CHIEF COMPLAINT:  Right leg redness and swelling with pain.   HISTORY OF PRESENT ILLNESS: The patient is a 49 year old Caucasian male with past medical history of obesity, coronary artery disease, chronic atrial fibrillation on Coumadin, obstructive sleep apnea noncompliant with CPAP, hypertension, systolic congestive heart failure with an ejection fraction of 25%.  He is presenting to the ER with a chief complaint of worsening of the right leg swelling for the past few days. The patient has chronic history of bilateral lower extremity lymphedema and peripheral vascular disease. He usually gets cellulitis on the left lower extremity but for the past few days, his right lower extremity is swollen and for the past 2 to 3 days he has been having pain with redness. Also, he has noticed oozing of some clear liquid from the right lower extremity with foul smell.  The patient used to work as a Radiation protection practitionerparamedic, but right now because of his morbid obesity, he is on disability. The patient came into the ER and diagnosed with cellulitis. He was given IV vancomycin and cefepime and hospitalist team is called to admit the patient. The patient denies any chest pain or shortness of breath. No similar complaint of cellulitis in the right lower extremity. The patient takes Coumadin for his chronic atrial fibrillation and his INR today is 2.1. Right lower extremity venous Dopplers were ordered to rule out any underlying DVT, which is pending at this time. The patient denies any chest pain or shortness of breath. No other complaints. No family members at bedside.   PAST MEDICAL HISTORY: Coronary artery disease status post MI, cardiomyopathy, congestive heart failure with an ejection fraction of 25%, chronic atrial fibrillation on Coumadin for anticoagulation, obstructive sleep apnea  noncompliant with CPAP, morbid obesity, hypertension, hyperlipidemia, chronic lower extremity edema more on the left side than on the right.     PAST SURGICAL HISTORY: Tonsillectomy and history of uvulopalatopharyngoplasty for his sleep apnea.   ALLERGIES:  BEE STINGS but not allergic to any medication.   PSYCHOSOCIAL HISTORY: Lives with friends.  Denies any smoking.  Occasional drinking. Denies any illicit drug usage.  Working as a Hydrologistpart-time taxi driver, currently.   FAMILY HISTORY: Mother had ovarian cancer.  Father had diabetes. Multiple family members with cancer on maternal side.  HOME MEDICATIONS:  Metoprolol  twice a day, magnesium oxide 400 mg 2 times a day, lisinopril 5 mg once daily, Lasix 40 mg 2 times a day, digoxin 125 mg once daily, Coumadin 11 once daily, amiodarone 100 mg 2 times a day .    REVIEW OF SYSTEMS:   CONSTITUTIONAL:  Denies any fever, fatigue, chills or rigors.  EYES: Denies blurry vision, double vision.  ENT: Denies epistaxis, discharge.  RESPIRATION: Denies cough or COPD.  CARDIOVASCULAR: Denies any chest pain or palpitation.  GASTROINTESTINAL: Denies nausea, vomiting, diarrhea.  GENITOURINARY: No dysuria, hematuria or hernia.  ENDOCRINE: Denies polyuria, nocturia, thyroid problems.  HEMATOLOGIC AND LYMPHATIC: No anemia, easy bruising, bleeding.  INTEGUMENTARY: No acne, rash, lesions.  Chronic lower extremity edema bilaterally. No other rashes.  MUSCULOSKELETAL: No joint effusion, tenderness, erythema. NEUROLOGIC:  No vertigo or ataxia. Denies any history of strokes in the past.  PSYCHIATRIC: No ADD or OCD.   PHYSICAL EXAMINATION: VITAL SIGNS: Temperature 98 degrees Fahrenheit, pulse 112, respirations 20, blood pressure 118/56, pulse oximetry 96%.  GENERAL APPEARANCE: Morbidly  obese, but not in acute distress.  HEENT: Normocephalic, atraumatic. Pupils are equal, react to light and accommodation. No scleral icterus. No conjunctival injection. No sinus  tenderness. No postnasal drip. Dry mucous membranes.  NECK: Supple. No JVD. No thyromegaly. Range of motion is intact.  LUNGS: Clear to auscultation bilaterally. No accessory muscle use and no anterior chest wall tenderness on palpation.  CARDIAC: S1, S2 normal. Regular rate and rhythm.  Chronic peripheral edema.  GASTROINTESTINAL: Soft, obese. Bowel sounds are positive in all 4 quadrants.  There are distant bowel sounds. No masses. No hepatosplenomegaly.  NEUROLOGIC: Awake, alert, oriented x 3. Motor and sensory are grossly intact. Has chronic peripheral vascular disease. Reflexes 2+.  EXTREMITIES: Right lower extremity is edematous ,tender and foul smelling with liquid oozing from the wound.  PSYCHIATRIC: Normal mood and affect.   LABORATORY AND IMAGING STUDIES: LFTs: Total protein is at 8.6, albumin 2.4, AST is elevated,  the rest of the LFTs are normal. WBC 14.4, hemoglobin is 13.4, hematocrit 39.9, platelets 266, PT 23.1, INR 2.1, PTT 142.8. Urinalysis: Hazy in appearance, ketones negative, leukocyte esterase negative. Magnesium is 1.4. The patient had  magnesium sulfate IV in the ER, BUN 22, creatinine 1.17, sodium 133, potassium 3.1, chloride 98, CO2 26, GFR greater than 60. Anion gap 9, serum osmolality 272, calcium 8.6 .  Right lower extremity venous Dopplers:  No evidence of deep vein thrombosis.   ASSESSMENT AND PLAN: A 49 year old Caucasian male presenting to the ER with a chief complaint of worsening of edema, pain, redness in the right lower extremity.  He will be admitted with the following assessment and plan.  1.  Acute right lower extremity cellulitis. Blood cultures and wound cultures were obtained,. The patient was given IV cefepime and vancomycin in the ER. Will continue Rocephin and vancomycin.  2.  Chronic history of atrial fibrillation on Coumadin. INR is therapeutic. PT-INR will be checked on a daily basis and Coumadin needs to be dosed by pharmacy.  3.  Obstructive sleep apnea  noncompliant with CPAP.  4.  Hypertension. Blood pressure medications are adjusted.  Will uptitrate the medication on as-needed basis.  5.  Coronary artery disease, congestive heart failure and cardiomyopathy with an ejection fraction of 25%. We will monitor the symptoms and signs for fluid overload. Continue his home medication. We will continue aspirin, digoxin and amiodarone. We will provide him gastrointestinal and deep vein thrombosis prophylaxis.  He is a FULL CODE.  The diagnosis and plan of care was discussed in detail with the patient. He is aware of the plan.    TIME SPENT ON ADMISSION:  45 min   ____________________________ Ramonita Lab, MD ag:dp D: 12/20/2012 05:33:00 ET T: 12/20/2012 06:46:57 ET JOB#: [id number]  cc: Ramonita Lab, MD, <Dictator> Ramonita Lab MD ELECTRONICALLY SIGNED 12/25/2012 5:53

## 2014-05-07 NOTE — Consult Note (Signed)
Push enteroscopy done today for continued melena and drop in Hgb.   Findings:  two large duodenal ulcers with clean base.  No stigmata such as visible vessel.  They are low risk to re-bleed. No evidence of active bleeding.   Plan:  -cont PPI drip for 72 hours - h pylori serology - clear liquid diet - cont to monitor h/h and hemodynamics - no more NSAIDS - carafate 1 gr TID - will cont to follow.   Electronic Signatures: Dow Adolphein, Dulcey Riederer (MD)  (Signed on 11-Dec-14 13:50)  Authored  Last Updated: 11-Dec-14 13:50 by Dow Adolphein, Alayiah Fontes (MD)

## 2014-05-07 NOTE — Consult Note (Signed)
GI follow up. further bleeding. stable.   CTA, no w/creg, no m/r/gnt,nd,nabs, obese  Ok to start soft mechanical dietchange PPI to protonix 40 mg BIDcont carafate 1 gr TID.    Electronic Signatures: Dow Adolphein, Matthew (MD)  (Signed on 16-Dec-14 18:03)  Authored  Last Updated: 16-Dec-14 18:03 by Dow Adolphein, Matthew (MD)

## 2014-05-07 NOTE — Consult Note (Signed)
EGD showed mediumsized ulcer with a clean base. However, fresh blood in duodenum. It appears that there a few small superficial ulcers, with one in particular, nearby that were oozing blood. Therefore, these were all cauterized. Clear liquid diet ordered. Moniter patient closely. Dr. Shelle Ironein will check back tomorrow. Thanks.  Electronic Signatures: Lutricia Feilh, Montgomery Rothlisberger (MD)  (Signed on 14-Dec-14 09:45)  Authored  Last Updated: 14-Dec-14 09:45 by Lutricia Feilh, Eian Vandervelden (MD)

## 2014-05-07 NOTE — Consult Note (Signed)
Chief Complaint:  Subjective/Chief Complaint Covering for Dr. Rayann Heman. Was stable until yest afternoon when patient passed large amount of blood with signif drop in hgb and SBP. Transferred back to ICU. Given total of 3 units of blood overnight with sl improvement in hgb. No abd pain. Still passing some blood per rectum.   VITAL SIGNS/ANCILLARY NOTES: **Vital Signs.:   14-Dec-14 07:00  Pulse Pulse 110  Respirations Respirations 18  Systolic BP Systolic BP 158  Diastolic BP (mmHg) Diastolic BP (mmHg) 63  Mean BP 83  Pulse Ox % Pulse Ox % 93  Oxygen Delivery Room Air/ 21 %  Pulse Ox Heart Rate 102   Brief Assessment:  GEN no acute distress   Cardiac Regular   Respiratory clear BS   Gastrointestinal Normal   Lab Results: Routine Chem:  14-Dec-14 05:05   Glucose, Serum 86  BUN  43  Creatinine (comp)  3.84  Sodium, Serum 136  Potassium, Serum 3.7  Chloride, Serum 104  CO2, Serum 26  Calcium (Total), Serum  7.9  Anion Gap  6  Osmolality (calc) 282  eGFR (African American)  20  eGFR (Non-African American)  18 (eGFR values <53m/min/1.73 m2 may be an indication of chronic kidney disease (CKD). Calculated eGFR is useful in patients with stable renal function. The eGFR calculation will not be reliable in acutely ill patients when serum creatinine is changing rapidly. It is not useful in  patients on dialysis. The eGFR calculation may not be applicable to patients at the low and high extremes of body sizes, pregnant women, and vegetarians.)  Result Comment LABS - This specimen was collected through an   - indwelling catheter or arterial line.  - A minimum of 515m of blood was wasted prior    - to collecting the sample.  Interpret  - results with caution.  Result(s) reported on 28 Dec 2012 at 05:38AM.  Routine Hem:  14-Dec-14 05:05   WBC (CBC)  16.3  RBC (CBC)  2.42  Hemoglobin (CBC)  7.1  Hematocrit (CBC)  21.1  Platelet Count (CBC) 175 (Result(s) reported on 28 Dec 2012  at 06[facility]  MCV 87  MCH 29.3  MCHC 33.6  RDW  14.9  Bands 5  Segmented Neutrophils 77  Lymphocytes 8  Monocytes 6  Eosinophil 1  Basophil 1  Metamyelocyte 2  Diff Comment 1 ANISOCYTOSIS  Diff Comment 2 POIKILOCYTOSIS  Diff Comment 3 MICROCYTES PRESENT  Diff Comment 4 PLTS VARIED IN SIZE  Result(s) reported on 28 Dec 2012 at 06:05AM.   Assessment/Plan:  Assessment/Plan:  Assessment Recurrent bleeding from duodenal ulcer though patient supposedly had a clean base.   Plan Pt NPO since yest. Moniter hgb. Plan on repeating EGD this AM and cauterize/inject ulcer area in necessary.   Electronic Signatures: OhVerdie ShireMD)  (Signed 14-Dec-14 07:59)  Authored: Chief Complaint, VITAL SIGNS/ANCILLARY NOTES, Brief Assessment, Lab Results, Assessment/Plan   Last Updated: 14-Dec-14 07:59 by OhVerdie ShireMD)

## 2014-05-07 NOTE — Consult Note (Signed)
Brief Consult Note: Diagnosis: cellulitis.   Patient was seen by consultant.   Consult note dictated.   Comments: Appreciate  consult for 49 yo caucasian man with history of DM, PVD, obesity, afib on coumadin therapy, admitted with rle cellulitis for ondet rectal bleeding today. Has had recent GIB scan that looks like he is having some active bleeding in the large colon:  I have discussed this with Leeroy Bockhelsea, Dr Driscilla Grammesew's NP who has spoken with him and he is coming to see patient to eval for embolization. Impression and plan: diverticular bleeding- with initial report of active bleeding on GIB scan. Vascular has been consulted and coming to eval for embolization. Continue current. Will follow with you. Of note, his coumadin is on hold and he is receiving FFP to correct his inr for anticipated procedure. Would also follow hgb and xfuse prn.  Addendum: noted that bleeding appeared duodenal on GIB scan, have discussed with Dr Marva PandaSkulskie and Wyn Quakerew, started Pantoprazole gtt. as his anticoagulation is being reversed with FFP, this may resolve the bleeding. Will follow.  Electronic Signatures: Vevelyn PatLondon, Janene Yousuf H (NP)  (Signed 08-Dec-14 15:53)  Authored: Brief Consult Note   Last Updated: 08-Dec-14 15:53 by Keturah BarreLondon, Paddy Walthall H (NP)

## 2014-05-07 NOTE — Discharge Summary (Signed)
PATIENT NAME:  Gerald Roth, [name] MR#:  [id number] DATE OF BIRTH:  October 10, 1965  DATE OF ADMISSION:  09/03/2012 DATE OF DISCHARGE:  09/14/2012  PRIMARY CARE PHYSICIAN:  None local.  CONSULTATION: Cardiology, Dr. Lady GaryFath.   VASCULAR SURGEON: Dr. Gilda CreaseSchnier.  DISCHARGE ADDENDUM: For detailed discharge summary, please refer to the interim discharge summary dictated by Dr. Sherryll BurgerShah.   DISCHARGE DIAGNOSES: 1.  Atrial fibrillation.  2.  Acute-on-chronic systolic congestive heart failure, with ejection of 25%.  3.  Left lower extremity cellulitis.  4.  Elevated troponin, possibly due to demand ischemia.  5.  Acute renal failure.  6.  Peripheral vascular disease.  7.  Edema.  8.  Chronic venous stasis,  9.  Obstructive sleep apnea (OSA).  CONDITION:  Stable.   CODE STATUS: FULL CODE.   HOME MEDICATIONS:  1.  Mag-Ox 400 mg p.o. b.i.d. 2.  Protonix 40 mg p.o. daily.  3.  Lisinopril 5 mg p.o. daily.  4.  Augmentin 875 mg/125 mg p.o. every 12 hours for 10 days.  5.  Lasix 40 mg p.o. b.i.d.  6.  Amiodarone 100 mg p.o. b.i.d.  7.  Digoxin 125 mcg p.o. daily.  8.  Coumadin 11 mg p.o. daily.  9.  Lopressor 100 mg p.o. b.i.d.  10.  Colace/senna 50 mg/8.6 mg p.o. t.i.d.   DIET: Low-sodium, low-fat, low-cholesterol diet.   ACTIVITY: As tolerated.   FOLLOWUP CARE: Follow up with Dr. Lady GaryFath within 1 week.   Follow up INR on 09/16/2012 in Dr. America BrownFath's office, and also follow up with Dr. Gilda CreaseSchnier within  1 to 2 weeks.   The patient has no PCP. The patient needs followup at [facility].   The patient also needs  followup INR with Dr. Lady GaryFath.   HOSPITAL COURSE: Please refer to the interim discharge summary dictated by  Dr. Sherryll BurgerShah 3 days ago.   1.  The patient has been on Coumadin for a-fib. Since INR was not therapeutic Coumadin was increased to 10 mg, but for the past 2 days the patient's INR has been 1.9, however it is stable. We will increase Coumadin to 11 mg  p.o. daily and follow up INR as an  outpatient. Dr. Lady GaryFath also suggested continuing amiodarone, digoxin and Lopressor 100 mg p.o. b.i.d.  2.  Left lower extremity cellulitis with underlying edema and chronic venous stenosis: The patient's  cellulitis has been improving slowly. The patient needs continuing antibiotics, Augmentin, for 10 days, and follow up with Dr. Gilda CreaseSchnier as an outpatient.  3.  Leukocytosis: Has improved.  4.  Acute renal failure: Has improved.  5. Acute-on-chronic systolic CHF, with ejection fraction 20% to 25%. The patient has been treated with Lasix and lisinopril. We will continue the medication.  6. For obstructive sleep apnea the patient has been treated with CPAP at night. The patient has no symptoms. Vital signs are stable. He is clinically stable. Will be discharged to home today.   Discussed the patient's discharge plan with the patient and case manager. Since lost his insurance case manager will help the patient get medication. Otherwise the patient's will comply  to appointments and to follow up INR closely with Dr. Lady GaryFath.   Time spent: About forty-two minutes.   ____________________________ Shaune PollackQing Charnese Federici, MD qc:dm D: 09/14/2012 14:23:34 ET T: 09/15/2012 07:30:14 ET JOB#: [id number]  cc: Shaune PollackQing Teresha Hanks, MD, <Dictator> Shaune PollackQING Rhayne Chatwin MD ELECTRONICALLY SIGNED 09/15/2012 16:06

## 2014-05-07 NOTE — Consult Note (Signed)
Chief Complaint:  Subjective/Chief Complaint seen for GI bleeding.  No hematochezia since yesterday afternoon.  One episode of coffeeground emesis, hemodynamically stable though low bp, not on pressor.  patient currently denies nausea or abdominal pain.   VITAL SIGNS/ANCILLARY NOTES: **Vital Signs.:   09-Dec-14 07:25  Vital Signs Type Blood Transfusion Complete  Temperature Temperature (F) 98.2  Celsius 36.7  Temperature Source axillary  Pulse Pulse 113  Pulse source if not from Vital Sign Device per cardiac monitor  Respirations Respirations 16  Systolic BP Systolic BP 196  Diastolic BP (mmHg) Diastolic BP (mmHg) 25  Mean BP 56  BP Source  if not from Vital Sign Device non-invasive  Pulse Ox % Pulse Ox % 97  Oxygen Delivery 2L   Brief Assessment:  Cardiac Irregular   Respiratory clear BS   Gastrointestinal details normal Nontender  Bowel sounds normal  No gaurding  obese,   Lab Results: TDMs:  09-Dec-14 04:46   Vancomycin, Trough LAB  42  Routine Chem:  06-Dec-14 02:08   BUN  22  Creatinine (comp) 1.17  07-Dec-14 04:55   BUN 10  Creatinine (comp) 0.97  08-Dec-14 04:53   Creatinine (comp) 0.82    21:33   BUN  27  Creatinine (comp)  2.02  09-Dec-14 03:09   Result Comment DIFFERENTIAL - AUTOMATED DIFFERENTIAL VERIFIED BY  - MANUAL DIFFERENTIAL. RESULTS ARE  - CONSISTENT...TPL  Result(s) reported on 23 Dec 2012 at 04:45AM.  Glucose, Serum  144  BUN  35  Creatinine (comp)  2.77  Sodium, Serum 136  Potassium, Serum  6.0  Chloride, Serum 103  CO2, Serum  14  Calcium (Total), Serum  7.6  Anion Gap  19  Osmolality (calc) 282  eGFR (African American)  30  eGFR (Non-African American)  26 (eGFR values <65m/min/1.73 m2 may be an indication of chronic kidney disease (CKD). Calculated eGFR is useful in patients with stable renal function. The eGFR calculation will not be reliable in acutely ill patients when serum creatinine is changing rapidly. It is not useful in   patients on dialysis. The eGFR calculation may not be applicable to patients at the low and high extremes of body sizes, pregnant women, and vegetarians.)    04:46   Result Comment T VANCOMYCIN - NOTIFIED OF CRITICAL VALUE  - RESULTS VERIFIED BY REPEAT TESTING.  - CALLED TO NICKOLA GLOGOVAC AT 02229ON  - 12/23/12...TPL  - READ-BACK PROCESS PERFORMED.  Result(s) reported on 23 Dec 2012 at 06:18AM.  Routine Coag:  06-Dec-14 02:08   INR 2.1 (INR reference interval applies to patients on anticoagulant therapy. A single INR therapeutic range for coumarins is not optimal for all indications; however, the suggested range for most indications is 2.0 - 3.0. Exceptions to the INR Reference Range may include: Prosthetic heart valves, acute myocardial infarction, prevention of myocardial infarction, and combinations of aspirin and anticoagulant. The need for a higher or lower target INR must be assessed individually. Reference: The Pharmacology and Management of the Vitamin K  antagonists: the seventh ACCP Conference on Antithrombotic and Thrombolytic Therapy. [id number].1194Sept:126 (3suppl): [id number] A HCT value >55% may artifactually increase the PT.  In one study,  the increase was an average of 25%. Reference:  "Effect on Routine and Special Coagulation Testing Values of Citrate Anticoagulant Adjustment in Patients with High HCT Values." American Journal of Clinical Pathology 2006;126:400-405.)  07-Dec-14 04:55   INR 2.2 (INR reference interval applies to patients on anticoagulant therapy. A single INR therapeutic range  for coumarins is not optimal for all indications; however, the suggested range for most indications is 2.0 - 3.0. Exceptions to the INR Reference Range may include: Prosthetic heart valves, acute myocardial infarction, prevention of myocardial infarction, and combinations of aspirin and anticoagulant. The need for a higher or lower target INR must be assessed  individually. Reference: The Pharmacology and Management of the Vitamin K  antagonists: the seventh ACCP Conference on Antithrombotic and Thrombolytic Therapy. ESPQZ.3007 Sept:126 (3suppl): [id number]. A HCT value >55% may artifactually increase the PT.  In one study,  the increase was an average of 25%. Reference:  "Effect on Routine and Special Coagulation Testing Values of Citrate Anticoagulant Adjustment in Patients with High HCT Values." American Journal of Clinical Pathology 2006;126:400-405.)  08-Dec-14 04:53   INR 2.5 (INR reference interval applies to patients on anticoagulant therapy. A single INR therapeutic range for coumarins is not optimal for all indications; however, the suggested range for most indications is 2.0 - 3.0. Exceptions to the INR Reference Range may include: Prosthetic heart valves, acute myocardial infarction, prevention of myocardial infarction, and combinations of aspirin and anticoagulant. The need for a higher or lower target INR must be assessed individually. Reference: The Pharmacology and Management of the Vitamin K  antagonists: the seventh ACCP Conference on Antithrombotic and Thrombolytic Therapy. MAUQJ.3354 Sept:126 (3suppl): [id number]. A HCT value >55% may artifactually increase the PT.  In one study,  the increase was an average of 25%. Reference:  "Effect on Routine and Special Coagulation Testing Values of Citrate Anticoagulant Adjustment in Patients with High HCT Values." American Journal of Clinical Pathology 2006;126:400-405.)    21:33   INR 2.2 (INR reference interval applies to patients on anticoagulant therapy. A single INR therapeutic range for coumarins is not optimal for all indications; however, the suggested range for most indications is 2.0 - 3.0. Exceptions to the INR Reference Range may include: Prosthetic heart valves, acute myocardial infarction, prevention of myocardial infarction, and combinations of aspirin and  anticoagulant. The need for a higher or lower target INR must be assessed individually. Reference: The Pharmacology and Management of the Vitamin K  antagonists: the seventh ACCP Conference on Antithrombotic and Thrombolytic Therapy. TGYBW.3893 Sept:126 (3suppl): [id number]. A HCT value >55% may artifactually increase the PT.  In one study,  the increase was an average of 25%. Reference:  "Effect on Routine and Special Coagulation Testing Values of Citrate Anticoagulant Adjustment in Patients with High HCT Values." American Journal of Clinical Pathology 2006;126:400-405.)  09-Dec-14 04:45   Prothrombin  23.0  INR 2.1 (INR reference interval applies to patients on anticoagulant therapy. A single INR therapeutic range for coumarins is not optimal for all indications; however, the suggested range for most indications is 2.0 - 3.0. Exceptions to the INR Reference Range may include: Prosthetic heart valves, acute myocardial infarction, prevention of myocardial infarction, and combinations of aspirin and anticoagulant. The need for a higher or lower target INR must be assessed individually. Reference: The Pharmacology and Management of the Vitamin K  antagonists: the seventh ACCP Conference on Antithrombotic and Thrombolytic Therapy. TDSKA.7681 Sept:126 (3suppl): [id number]. A HCT value >55% may artifactually increase the PT.  In one study,  the increase was an average of 25%. Reference:  "Effect on Routine and Special Coagulation Testing Values of Citrate Anticoagulant Adjustment in Patients with High HCT Values." American Journal of Clinical Pathology 1572;620:355-974.)  Routine Hem:  06-Dec-14 02:08   Hemoglobin (CBC) 13.4  07-Dec-14 04:55   Hemoglobin (CBC)  11.3  08-Dec-14  07:18   Hemoglobin (CBC)  9.4 (Result(s) reported on 22 Dec 2012 at 07:46AM.)    14:26   Hemoglobin (CBC)  8.2 (Result(s) reported on 22 Dec 2012 at 02:44PM.)    21:33   Hemoglobin (CBC)  7.2 (Result(s) reported  on 22 Dec 2012 at 10:00PM.)  09-Dec-14 03:09   WBC (CBC)  35.7  RBC (CBC)  2.73  Hemoglobin (CBC)  7.2  Hematocrit (CBC)  23.5  Platelet Count (CBC) 320  MCV 86  MCH 26.3  MCHC  30.6  RDW  15.5  Neutrophil % 84.1  Lymphocyte % 6.5  Monocyte % 8.2  Eosinophil % 0.1  Basophil % 1.1  Neutrophil #  30.0  Lymphocyte # 2.3  Monocyte #  2.9  Eosinophil # 0.0  Basophil #  0.4   Radiology Results: Nuclear Med:    08-Dec-14 12:47, GI Blood Loss Study - Nuc Med  GI Blood Loss Study - Nuc Med   REASON FOR EXAM:    rectal bleed  COMMENTS:       PROCEDURE: NM  - NM GI BLOOD LOSS STUDY  - Dec 22 2012 12:47PM     CLINICAL DATA:  Hematochezia    EXAM:  NUCLEAR MEDICINE GASTROINTESTINAL BLEEDING SCAN    TECHNIQUE:  Sequential abdominal images were obtained following intravenous  administration of Tc-39mlabeled red blood cells.    COMPARISON:  Prior CT abdomen/pelvis 02/08/2010  RADIOPHARMACEUTICALS:  24.954m Tc-9964m-vitro labeled red cells.    FINDINGS:  Abnormal accumulation of radiotracer within a hollow viscus in the  left mid abdomen with relatively rapid progression through multiple  loops of bowel. The appearance is most consistent with small bowel  hemorrhage. There appears to be some uptake close to the upper  midline. Query a proximal (duodenum) GI source.     IMPRESSION:  Positive tagged red blood cell bleeding study. The bleed appears to  be emanating from the upper small bowel (possibly duodenum).  Recommend consultation with Gastroenterology for upper endoscopy.    Critical Value/emergent results were called by telephone at the time  of interpretation on 12/22/2012 at 1:42 PM to Dr. SRIHillary Bowwho  verbally acknowledged these results.      Electronically Signed    By: HeaJacqulynn CadetD.    On: 12/22/2012 13:42         Verified By: HEACriselda Peaches.D.,   Assessment/Plan:  Assessment/Plan:  Assessment 1) GI bleeding, currently stable.   no hematochezia sinceyesterday pm, though one episode of coffeeground emesis, hemodynamics stable though some low bp overnight, not on pressor. 2) elevated creatinine-likely ATN/prerenal.  patient hydrated overnight, given boluses. order for renal consult noted.   Plan 1) patient still with elevated pt, will give 2 unit ffp, has one more unit of prbc to recieve. Another dose of vit K but subcutaneoius,  will keep 2 units of prbc ahead.  EGD today, likely around noon, will need to recheck the PT beforehand.  I havd discussed with Mr ThoArzuagae risks benefits and complicatiosn of egd to include not limited to bleeding infection perforation and sedation and he wishes to proceed.   Electronic Signatures: SkuLoistine SimasD)  (Signed 09-Dec-14 07:52)  Authored: Chief Complaint, VITAL SIGNS/ANCILLARY NOTES, Brief Assessment, Lab Results, Radiology Results, Assessment/Plan   Last Updated: 09-Dec-14 07:52 by SkuLoistine SimasD)

## 2014-05-07 NOTE — Consult Note (Signed)
   Present Illness 49 yo male with histoyr of cardiomyopathy with ef of less than 20%, hsitory of small vessel cad, history of chornic afib treated wth Eliquis for anti coagulation, diltiazem and metoprolol  for rate control who was admitted iwth complaints of increasing shortness of breath and worsening peripheral edema. states he has been compliant with most of hs medications but has not been able to get the elequis. He was noted to have afib with rvr. He was admitted and placed on Roth cardizem drip and had his metoprolol increased. His rate has remained diffiuclt to control . Echo reveals continued reduced lv function with ef of les than 20%   Physical Exam:  GEN obese, morbid obesity   HEENT PERRL, hearing intact to voice   NECK supple   RESP no use of accessory muscles  rhonchi  crackles   CARD Irregular rate and rhythm  Tachycardic  No murmur   ABD obese   EXTR positive edema, sevee lower extremety edema to his thighs   SKIN positive rashes, lower extremety rah bilateraly   NEURO cranial nerves intact, motor/sensory function intact   PSYCH Roth+O to time, place, person   Review of Systems:  Subjective/Chief Complaint shortness of breath and rapid heart rate   General: No Complaints   Skin: Rashes   ENT: No Complaints   Eyes: No Complaints   Neck: No Complaints   Respiratory: Short of breath   Cardiovascular: Tightness  Edema   Gastrointestinal: No Complaints   Genitourinary: No Complaints   Vascular: No Complaints   Musculoskeletal: No Complaints   Neurologic: No Complaints   Hematologic: No Complaints   Endocrine: No Complaints   Psychiatric: No Complaints   Review of Systems: All other systems were reviewed and found to be negative   Medications/Allergies Reviewed Medications/Allergies reviewed   EKG:  Interpretation afib with rvr    Bee Stings: Other   Impression 49 yo male with history of chronic afib treated with cardizem and metorpolol and  anticoagulated iwht eliquis. He has been noncompliant with eliquis due to cost of the drug. He has been diffiuclt to control with iv cardizem and increased doses of metoprolol. Will discontinue iv cardizem and place on iv amiodarone and follow rate. Will discontinue eliquis and start on coumadin for cost reasons.   Plan 1. IV cardizem 2. Discontinue cardizem 3. Conitnue metorpolol 4. Coumadin  mg daily 5. disconitnue eliquis 6. Diurese 7. Further recs pending cousre   Electronic Signatures: [name], Gerald Roth (MD)  (Signed 21-Aug-14 20:41)  Authored: General Aspect/Present Illness, History and Physical Exam, Review of System, EKG , Allergies, Impression/Plan   Last Updated: 21-Aug-14 20:41 by [name], Gerald Roth (MD)

## 2014-05-07 NOTE — H&P (Signed)
PATIENT NAME:  Gerald Roth, Gerald Roth MR#:  [id number] DATE OF BIRTH:  03-01-1965  DATE OF ADMISSION:  09/03/2012  PRIMARY CARE PHYSICIAN: Follows at [facility], but has not seen them in a while.  PRIMARY CARDIOLOGIST: Dr. Gwen PoundsKowalski, last seen 8 months ago.  REFERRING PHYSICIAN: Caryn BeeKevin A. Paduchowski, MD  CHIEF COMPLAINT: Brought in for lower extremity swelling on the left.   HISTORY OF PRESENT ILLNESS: The patient is a morbidly obese 49 year old Caucasian male with a history of CAD, status post MI and cardiomyopathy, congestive heart failure, systolic CHF, EF of 25%, chronic A. fib on anticoagulation. He states that he has been having increased lower extremity edema in both legs, more so in the left lower extremity, and per Dr. Philemon KingdomKowalski's instruction took Lasix 80 mg and doubled his Lasix for a week, and that subsided the lower extremity swelling but recently it came back. The patient states that he has not missed any of his medications, except for the Eliquis which ran out about 2 days ago or so. As the swelling got worse and he had lower extremity pain in the left leg, he came in through the EMS. He was found to have significantly elevated heart rate up to 180s. He is in A. fib. He also had mild leukocytosis. He was given diltiazem IV 10 mg x 2 and was started on diltiazem drip. He initially came in hypotensive with initial blood pressure of 87/63 and initial heart rate of 174, but the heart rate has gone up to 180s at times. He is on diltiazem drip 5 mg now and hospitalist services were contacted for further evaluation and management. The patient has no chest pains of note.   PAST MEDICAL HISTORY:  1.  History of CAD, status post MI. 2.  Cardiomyopathy with congestive heart failure, EF of 25% per chart.  3.  Chronic A. fib, on Eliquis for anticoagulation.  4.  Obstructive sleep apnea, noncompliant with CPAP.  5.  Morbid obesity.  6.  Hypertension.  7.  Hyperlipidemia  8.  Chronic lower  extremity edema, more on the left than the right at baseline.  ALLERGIES: Bee stings, but no allergies to medications.   PAST SURGICAL HISTORY: Tonsillectomy and history of uvulopalatopharyngoplasty for his sleep apnea.   SOCIAL HISTORY: Lives with his brother. Not smoking anymore. Drinks 3 to 4 beers on Fridays. No drug use. 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   FAMILY HISTORY: Mom had ovarian cancer. Father had diabetes. History of multiple family members with cancer on the mom's side.   OUTPATIENT MEDICATIONS: Diltiazem 300 mg 1 cap once a day extended-release, Eliquis 5 mg 2 times a day, Lasix 40 mg once a day, lisinopril 5 mg daily, magnesium oxide 400 oral tab 1 tab 2 times a day, metoprolol 25 mg 2 times a day, Protonix 40 mg daily.   REVIEW OF SYSTEMS:    CONSTITUTIONAL: Had chills today as well as possible fever, but he did not measure a temperature. Denies weakness or numbness. Weight is steady.  EYES: No blurry vision or double vision.  EARS, NOSE, THROAT: No tinnitus, hearing loss. He has loud snoring. He has observed sleep apnea but is not complying with CPAP, as the CPAP machine is locked and he apparently has no money to unlock it in storage from what he states. RESPIRATORY: No cough, wheezing. Denies shortness of breath or painful respirations.  CARDIOVASCULAR: No chest pain but has worsening lower extremity edema, more on the left.  Has occasional 2-pillow orthopnea. Has A. fib and congestive heart failure.  GASTROINTESTINAL: Denies nausea, vomiting, abdominal pain. He has some right flank pain.  GENITOURINARY: Denies dysuria, hematuria or frequency.  HEMATOLOGIC AND LYMPHATIC: Denies anemia or easy bruising.  SKIN: Has chronic changes from swelling on the legs bilaterally.  MUSCULOSKELETAL: Denies arthritis or gout.  NEUROLOGIC: Denies focal weakness numbness, stroke or TIA. PSYCHIATRIC: Denies anxiety, insomnia or depression.   PHYSICAL EXAMINATION:  VITAL SIGNS:  Initial temperature was 97.3. Pulse rate initially 173. Currently his heart rate goes up to 150s and A. fib at times on the diltiazem drip. Initial respiratory rate 24. Initial blood pressure 87/63, last blood pressure 99/64. The patient is satting 100% on room air.  GENERAL: The patient is a morbidly obese, unkempt male lying in bed in mild distress.  HEENT: Normocephalic, atraumatic. Pupils are equal and reactive. Anicteric sclerae. Extraocular muscles intact. Moist mucous membranes.  NECK: Supple. No thyroid tenderness. No cervical lymphadenopathy.  CARDIOVASCULAR: S1, S2, tachycardic. Irregularly irregular. No significant murmurs could be appreciated.  LUNGS: Clear to auscultation. No wheezing, rhonchi or rales.  ABDOMEN: Soft, nontender, nondistended. Positive bowel sounds in all quadrants. The abdomen also has some small, perhaps 0.5 cm, multiple areas of redness in the lower abdomen, nontender, nonraised, no drainage. EXTREMITIES: The patient has significant lymphedema and chronic lower extremity edema, but appears to be more on the left than the right. Both legs appear to be warm to the touch to mid shins. There is no significant drainage in either leg, but there appears to be some erythema and tenderness in both legs, more on the left than the right, with chronic venous stasis changes NEUROLOGIC: Cranial nerves II through XII grossly intact. Strength is 5 out of 5 in all extremities. Sensation is intact to light touch.  PSYCHIATRIC: Awake, alert, oriented x 3.   LABORATORY, DIAGNOSTIC AND RADIOLOGICAL DATA: Glucose 128, BUN 15, creatinine 2, sodium 137, potassium 3.3. LFTs show bilirubin of 1.7, albumin of 2.9, otherwise within normal limits. Troponin is 0.07, CK-MB 0.5, CK total 131. WBC 16.4, hemoglobin 13.7; platelets are 223. EKG: There are 2 EKGs in the chart. Both show A. fib with RVR. One rate is 141, the other one is 181, with incomplete right bundle branch block and left anterior  fascicular block. They both exhibit Q waves in V1, V2, V3. No acute ST elevations. Somewhat ST depressions in I and aVL in one of the EKGs, and the other one shows some nonspecific T wave changes in aVL and some ST depressions in I. Chest x-ray has been done, and we are waiting for lower extremity Doppler on the left.   ASSESSMENT AND PLAN: We have a morbidly obese, with a body mass index of 85, Caucasian male with history of chronic systolic congestive heart failure, atrial fibrillation, history of coronary artery disease and myocardial infarction, obstructive sleep apnea, noncompliant with CPAP, who ran out of Eliquis a couple of days ago, who presents with worsening left lower extremity swelling, noted to be in atrial fibrillation with rapid ventricular response and somewhat hypotensive with renal failure. At this point, we would admit the patient to the Critical Care Unit. He has been started on diltiazem IV, but he is only on 5 mg. I would give a loading dose of 250 mcg of digoxin IV as well as 2.5 mg IV of metoprolol, as he still has significant tachycardia and his blood pressure is on the lower side, possibly because of the tachycardia.  We would start the patient also on oral diltiazem 30 mg every 6 hours. Heparin drip has been started, which I would continue. Possible etiology of this is perhaps something driving the heart rate, i.e. pulmonary embolism, but he had been on Eliquis and only ran out 2 days ago. The fact that he also has worsening lower extremity edema on the left makes this higher in the differential, but also he has had a fever and he has leukocytosis, which makes the likelihood of systemic inflammatory response syndrome driving this phenomenon also a possibility. At this point, he has been started on anticoagulation with heparin, which we would continue, and monitor the heart rate. He does not appear to be in overt congestive heart failure at this point. He does have acute renal failure,  which could be secondary to tachycardia as well as the increased Lasix dose he has been taking in the recent past. I would hold the Lasix at this point, monitor the BMP tomorrow for kidney function. He appears to have stage III renal failure at this point. He does have systemic inflammatory response syndrome criteria of tachycardia and leukocytosis. A possible etiology might be lower extremity cellulitis, and I would start him on Ancef with pharmacy dosing for now. He has no fever here, which we would monitor. He has a positive troponin, likely secondary to tachycardia and demand ischemia-related, but he has no chest pain per him. We would cycle the troponin, check a lipid profile, and give the patient aspirin and try to bring down the heart rate. We would order a left lower extremity Doppler as well as a V/Q scan for possible deep vein thrombosis or pulmonary embolism, but the patient will be anticoagulated anyways. An echocardiogram will be also ordered to see for any right ventricular dysfunction. A D-dimer in this particular instance would be nonspecific unless it is significantly elevated, but it does not ultimately change the management, as he will be on heparin drip nonetheless. We would start the patient on CPAP overnight, and he has been noncompliant with his CPAP. Overall, the patient is critically ill and at high risk of cardiopulmonary arrest and complications, and this was discussed with the patient. He wants to be full code.   TOTAL CRITICAL CARE TIME SPENT: 65 minutes.    ____________________________ Krystal Eaton, MD sa:jm D: 09/03/2012 19:03:10 ET T: 09/03/2012 19:28:11 ET JOB#: [id number]  cc: Krystal Eaton, MD, <Dictator> Krystal Eaton MD ELECTRONICALLY SIGNED 10/07/2012 13:49

## 2014-05-07 NOTE — Consult Note (Signed)
CHIEF COMPLAINT and HISTORY:  Subjective/Chief Complaint GI bleed   History of Present Illness Asked to see patient regarding GI bleed.  Admitted for cellulitis of right leg with ulcerations.  Is chronically on Coumadin for a. fib and has an INR >2.  Has had two dark BM today.  Hgb has dropped for 11.5 to 9.2 on most recent check.  In rapid a fib currently with HR over 110.   To evaluate a source of bleeding a nuc med scan was done which I have independently reviewed.  This shows an upper GI bleeding source likely in the duodenum with multiple loops of small bowel filling after an initial focus was seen.   PAST MEDICAL/SURGICAL HISTORY:  Past Medical History:   HL:    Hypertension:    Obesity:    GERD - Esophageal Reflux:    Atrial Fibrillation:    Sleep Apnea:    Myocardial Infarct:   ALLERGIES:  Allergies:  Bee Stings: Other  HOME MEDICATIONS:  Home Medications: Medication Instructions Status  amiodarone 100 mg oral tablet 1 tab(s) orally 2 times a day Active  metoprolol 100 mg oral tablet 1 tab(s) orally 2 times a day Active  digoxin 125 mcg (0.125 mg) oral tablet 1 tab(s) orally once a day Active  Mag-Ox 400 oral tablet 1 tab(s) orally 2 times a day Active  lisinopril 5 mg oral tablet 1 tab(s) orally once a day Active  Lasix 40 mg oral tablet 1 tab(s) orally 2 times a day Active  Coumadin 10 mg oral tablet 1 tab(s) orally once a day Active  omeprazole 20 mg oral delayed release tablet 1 tab(s) orally once a day Active  Coumadin 1 mg oral tablet 1 tab(s) orally once a day, take with 10 mg for total dose of 11 mg daily Active   Family and Social History:  Family History Hypertension  Cancer   Social History negative tobacco, positive ETOH   Place of Living Home   Review of Systems:  Subjective/Chief Complaint right leg pain and swelling melena obesity atrial fibrillation   Fever/Chills No   Cough No   Sputum No   Abdominal Pain No   Diarrhea No    Constipation No   Nausea/Vomiting No   SOB/DOE Yes   Chest Pain No   Telemetry Reviewed Afib   Dysuria No   Tolerating PT No   Tolerating Diet Yes   Medications/Allergies Reviewed Medications/Allergies reviewed   Physical Exam:  GEN well developed, well nourished, obese, prespiring, pale appearing   HEENT pink conjunctivae, moist oral mucosa   NECK No masses  trachea midline   RESP normal resp effort  no use of accessory muscles   CARD irregular rate  LE edema present  tachycardic   VASCULAR ACCESS none   ABD denies tenderness  normal BS   GU foley catheter in place  clear yellow urine draining   LYMPH negative neck, negative axillae   EXTR negative cyanosis/clubbing, positive edema   SKIN positive ulcers, venous stasis and ulceration on RLE   NEURO cranial nerves intact, motor/sensory function intact   PSYCH alert, A+O to time, place, person   LABS:  Laboratory Results: Thyroid:    07-Dec-14 04:55, Thyroid Stimulating Hormone  Thyroid Stimulating Hormone 2.07  0.45-4.50  (International [facility])   -----------------------  Pregnant patients have   different reference   ranges for TSH:   - - - - - - - - - -   Pregnant, first trimetser:  0.36 - 2.50 uIU/mL  LabObservation:    06-Dec-14 05:05, [place] Color Flow Doppler Lower Extrem Right (Leg)  OBSERVATION   Reason for Test Swelling  Hepatic:    06-Dec-14 02:08, Comprehensive Metabolic Panel  Bilirubin, Total 0.6  Alkaline Phosphatase 93  45-117  NOTE: New Reference Range  12/05/12  SGPT (ALT) 28  SGOT (AST) 51  Total Protein, Serum 8.6  Albumin, Serum 2.4  TDMs:    06-Dec-14 21:30, Vancomycin, Trough LAB  Vancomycin, Trough LAB 11  Result(s) reported on 20 Dec 2012 at 10:03PM.    07-Dec-14 21:22, Vancomycin, Trough LAB  Vancomycin, Trough LAB 8  Result(s) reported on 21 Dec 2012 at 09:47PM.    08-Dec-14 04:53, Digoxin, Serum  Digoxin, Serum 0.32  Therapeutic range for digoxin in patients with  atrial  fibrillation: 0.8 - 2.0 ng/mL.  In patients with congestive heart failure a therapeutic  range of 0.5 - 0.8 ng/mL is suggested as higher levels are  associated with an increased risk of toxicity without clear  evidence of enhanced efficacy.  Digoxin toxicity is commonly associated with serum levels  > 2.0 ng/mL but may occur with lower levels, including those  in the therapeutic range.  Blood samples should be obtained 6-8 hours after administration  to assure a reasonable volume of distribution.  Routine BB:    08-Dec-14 07:18, Type and Antibody Screen  ABO Group + Rh Type   A Positive  Antibody Screen NEGATIVE  Result(s) reported on 22 Dec 2012 at 08:50AM.    08-Dec-14 09:20, Fresh Frozen Plasma - 2 Units  Fresh Frozen Plasma Unit 1 Issued  Fresh Frozen Plasma Unit 2 Issued  Result(s) reported on 22 Dec 2012 at 01:33PM.  Routine Micro:    06-Dec-14 02:09, Blood Culture  Micro Text Report   BLOOD CULTURE    COMMENT                   NO GROWTH IN 48 HOURS     ANTIBIOTIC  Culture Comment   NO GROWTH IN 48 HOURS   Result(s) reported on 22 Dec 2012 at 02:00AM.    06-Dec-14 02:14, Wound Aerobic/Anaerobic Culture  Organism Name   [place] ROD  Organism Quantity RARE  Organism Name   STREPTOCOCCUS PYOGENES (GROUP A)  Organism Quantity   HEAVY GROWTH  Organism Name   STAPHYLOCOCCUS AUREUS  Organism Quantity   HEAVY GROWTH  Micro Text Report   WOUND AER/ANAEROBIC CULT    ORGANISM 1                HEAVY GROWTH STAPHYLOCOCCUS AUREUS    ORGANISM 2                HEAVY GROWTH STREPTOCOCCUS PYOGENES (GROUP A)    ORGANISM 3                RARE GRAM NEGATIVE ROD    COMMENT                   SENSITIVITIES TO FOLLOW    COMMENT                   -    COMMENT                   WITH NORMAL SKIN FLORA    GRAM STAIN                FEW WHITE BLOOD CELLS    GRAM STAIN  MANY GRAM POSITIVE COCCI IN CHAINS AND PAIRS    GRAM STAIN    RARE GRAM POSITIVE ROD    GRAM  STAIN                RARE GRAM NEGATIVE ROD     ANTIBIOTIC  Specimen Source   R ANTERIOR LEG  Organism 1   HEAVY GROWTH STAPHYLOCOCCUS AUREUS  Organism 2   HEAVY GROWTH STREPTOCOCCUS PYOGENES (GROUP A)  Organism 3   RARE GRAM NEGATIVE ROD  Culture Comment   SENSITIVITIES TO FOLLOW  Culture Comment . -  Culture Comment    .   WITH NORMAL SKIN FLORA  Gram Stain 1   FEW WHITE BLOOD CELLS  Gram Stain 2   MANY GRAM POSITIVE COCCI IN CHAINS AND PAIRS  Gram Stain 3   RARE GRAM POSITIVE ROD  Gram Stain 4   RARE GRAM NEGATIVE ROD    06-Dec-14 03:08, Blood Culture  Micro Text Report   BLOOD CULTURE    COMMENT                   NO GROWTH IN 48 HOURS     ANTIBIOTIC  Culture Comment   NO GROWTH IN 48 HOURS   Result(s) reported on 22 Dec 2012 at 03:00AM.  Cardiology:    06-Dec-14 02:26, ED ECG  Ventricular Rate 115  Atrial Rate 73  QRS Duration 108  QT 350  QTc 484  R Axis -33  T Axis 64  ECG interpretation   Atrial fibrillation with rapid ventricular response with premature ventricular or aberrantly conducted complexes  Left axis deviation  Incomplete right bundle branch block  Cannot rule out Anterior infarct (cited on or before 25-May-1997)  Abnormal ECG  When compared with ECG of 03-Sep-2012 15:56,  No significant change was found  ----------unconfirmed----------  Confirmed by OVERREAD, NOT ([id number]), editor PEARSON, BARBARA ([id number]) on 12/22/2012 2:19:23 PM  ED ECG   Routine Chem:    06-Dec-14 02:08, Comprehensive Metabolic Panel  Glucose, Serum 131  BUN 22  Creatinine (comp) 1.17  Sodium, Serum 133  Potassium, Serum 3.1  Chloride, Serum 98  CO2, Serum 26  Calcium (Total), Serum 8.6  Osmolality (calc) 272  eGFR (African American) >60  eGFR (Non-African American) >60  eGFR values <27m/min/1.73 m2 may be an indication of chronic  kidney disease (CKD).  Calculated eGFR is useful in patients with stable renal function.  The eGFR calculation will not be reliable in  acutely ill patients  when serum creatinine is changing rapidly. It is not useful in   patients on dialysis. The eGFR calculation may not be applicable  to patients at the low and high extremes of body sizes, pregnant  women, and vegetarians.  Anion Gap 9    06-Dec-14 02:08, Magnesium, Serum  Magnesium, Serum 1.4  1.8-2.4  THERAPEUTIC RANGE: 4-7 mg/dL  TOXIC: > 10 mg/dL   -----------------------    06-Dec-14 02:14, Wound Aerobic/Anaerobic Culture  Result Comment   GROUP A STREP - There is no known Penicillin Resistant   - Beta Streptococcus in the [place]  For    - patients that are Penicillin-allergic,   - Erythromycin is 85-94% susceptible, and   - Clindamycin is 80% susceptible.  Contact    - Microbiology within 7 days if    - sensitivity testing is required.   Result(s) reported on 22 Dec 2012 at 11:56AM.    07-Dec-14 026:37 Basic Metabolic Panel (w/Total Calcium)  Glucose, Serum  100  BUN 10  Creatinine (comp) 0.97  Sodium, Serum 135  Potassium, Serum 3.3  Chloride, Serum 98  CO2, Serum 30  Calcium (Total), Serum 8.2  Anion Gap 7  Osmolality (calc) 269  eGFR (African American) >60  eGFR (Non-African American) >60  eGFR values <80m/min/1.73 m2 may be an indication of chronic  kidney disease (CKD).  Calculated eGFR is useful in patients with stable renal function.  The eGFR calculation will not be reliable in acutely ill patients  when serum creatinine is changing rapidly. It is not useful in   patients on dialysis. The eGFR calculation may not be applicable  to patients at the low and high extremes of body sizes, pregnant  women, and vegetarians.    07-Dec-14 04:55, Hemoglobin A1c ([facility])  Hemoglobin A1c ([facility] 6.5  The American Diabetes Association recommends that a primary goal of  therapy should be <7% and that physicians should reevaluate the  treatment regimen in patients with HbA1c values consistently >8%.    08-Dec-14 04:53, Creatinine Serum  Creatinine (comp)  0.82  eGFR (African American) >60  eGFR (Non-African American) >60  eGFR values <631mmin/1.73 m2 may be an indication of chronic  kidney disease (CKD).  Calculated eGFR is useful in patients with stable renal function.  The eGFR calculation will not be reliable in acutely ill patients  when serum creatinine is changing rapidly. It is not useful in   patients on dialysis. The eGFR calculation may not be applicable  to patients at the low and high extremes of body sizes, pregnant  women, and vegetarians.  Routine UA:    06-Dec-14 02:10, Urinalysis  Color (UA) Amber  Clarity (UA) Hazy  Glucose (UA) Negative  Bilirubin (UA) Negative  Ketones (UA) Negative  Specific Gravity (UA) 1.027  Blood (UA) 1+  pH (UA) 6.0  Protein (UA) 30 mg/dL  Nitrite (UA) Negative  Leukocyte Esterase (UA) Negative  Result(s) reported on 20 Dec 2012 at 03:27AM.  RBC (UA) <1 /HPF  WBC (UA) <1 /HPF  Bacteria (UA)   NONE SEEN  Epithelial Cells (UA)   NONE SEEN  Mucous (UA) PRESENT  Result(s) reported on 20 Dec 2012 at 03:27AM.  Routine Coag:    06-Dec-14 02:08, Activated PTT  Activated PTT (APTT) 42.8  A HCT value >55% may artifactually increase the APTT. In one study,  the increase was an average of 19%.  Reference: "Effect on Routine and Special Coagulation Testing Values  of Citrate Anticoagulant Adjustment in Patients with High HCT Values."  American Journal of Clinical Pathology 2006;126:400-405.    06-Dec-14 02:08, Prothrombin Time  Prothrombin 23.1  INR 2.1  INR reference interval applies to patients on anticoagulant therapy.  A single INR therapeutic range for coumarins is not optimal for all  indications; however, the suggested range for most indications is  2.0 - 3.0.  Exceptions to the INR Reference Range may include: Prosthetic heart  valves, acute myocardial infarction, prevention of myocardial  infarction, and combinations of aspirin and anticoagulant. The need  for a higher or lower  target INR must be assessed individually.  Reference: The Pharmacology and Management of the Vitamin K   antagonists: the seventh ACCP Conference on Antithrombotic and  Thrombolytic Therapy. [id number]:126 (3suppl): [id number] A HCT value >55% may artifactually increase the PT.  In one study,   the increase was an average of 25%.  Reference:  "Effect on Routine and Special Coagulation Testing Values  of Citrate Anticoagulant Adjustment in Patients with  High HCT Values."  American Journal of Clinical Pathology 2006;126:400-405.    07-Dec-14 04:55, Prothrombin Time  Prothrombin 23.9  INR 2.2  INR reference interval applies to patients on anticoagulant therapy.  A single INR therapeutic range for coumarins is not optimal for all  indications; however, the suggested range for most indications is  2.0 - 3.0.  Exceptions to the INR Reference Range may include: Prosthetic heart  valves, acute myocardial infarction, prevention of myocardial  infarction, and combinations of aspirin and anticoagulant. The need  for a higher or lower target INR must be assessed individually.  Reference: The Pharmacology and Management of the Vitamin K   antagonists: the seventh ACCP Conference on Antithrombotic and  Thrombolytic Therapy. [id number] Sept:126 (3suppl): [id number].  A HCT value >55% may artifactually increase the PT.  In one study,   the increase was an average of 25%.  Reference:  "Effect on Routine and Special Coagulation Testing Values  of Citrate Anticoagulant Adjustment in Patients with High HCT Values."  American Journal of Clinical Pathology 2006;126:400-405.    08-Dec-14 04:53, Prothrombin Time  Prothrombin 26.2  INR 2.5  INR reference interval applies to patients on anticoagulant therapy.  A single INR therapeutic range for coumarins is not optimal for all  indications; however, the suggested range for most indications is  2.0 - 3.0.  Exceptions to the INR Reference Range may  include: Prosthetic heart  valves, acute myocardial infarction, prevention of myocardial  infarction, and combinations of aspirin and anticoagulant. The need  for a higher or lower target INR must be assessed individually.  Reference: The Pharmacology and Management of the Vitamin K   antagonists: the seventh ACCP Conference on Antithrombotic and  Thrombolytic Therapy. [id number] Sept:126 (3suppl): [id number].  A HCT value >55% may artifactually increase the PT.  In one study,   the increase was an average of 25%.  Reference:  "Effect on Routine and Special Coagulation Testing Values  of Citrate Anticoagulant Adjustment in Patients with High HCT Values."  American Journal of Clinical Pathology 2006;126:400-405.  Routine Hem:    06-Dec-14 02:08, Hemogram, Platelet Count  WBC (CBC) 14.4  RBC (CBC) 4.87  Hemoglobin (CBC) 13.4  Hematocrit (CBC) 39.9  Platelet Count (CBC) 266  Result(s) reported on 20 Dec 2012 at 02:30AM.  MCV 82  MCH 27.5  MCHC 33.5  RDW 16.1    07-Dec-14 04:55, CBC Profile  WBC (CBC) 12.8  RBC (CBC) 4.14  Hemoglobin (CBC) 11.3  Hematocrit (CBC) 34.0  Platelet Count (CBC) 231  MCV 82  MCH 27.4  MCHC 33.4  RDW 16.6  Neutrophil % 82.1  Lymphocyte % 7.7  Monocyte % 9.6  Eosinophil % 0.3  Basophil % 0.3  Neutrophil # 10.5  Lymphocyte # 1.0  Monocyte # 1.2  Eosinophil # 0.0  Basophil # 0.0  Result(s) reported on 21 Dec 2012 at 05:45AM.    08-Dec-14 07:18, Hemoglobin  Hemoglobin (CBC) 9.4  Result(s) reported on 22 Dec 2012 at 07:46AM.    08-Dec-14 14:26, Hemoglobin  Hemoglobin (CBC) 8.2  Result(s) reported on 22 Dec 2012 at 02:44PM.   RADIOLOGY:  Radiology Results: XRay:    22-Jan-14 22:50, Chest Portable Single View  Chest Portable Single View  REASON FOR EXAM:    chest pain  COMMENTS:       PROCEDURE: DXR - DXR PORTABLE CHEST SINGLE VIEW  - Feb 06 2012 10:50PM     RESULT: Comparison is made to the study of 17 February 2011.  Cardiac monitoring  electrodes present. Cardiac silhouette is enlarged but   unchanged. The lungs are clear. The bony and mediastinal structures are   unremarkable.    IMPRESSION:   1. Stable cardiomegaly. No acute cardiopulmonary disease evident.    Dictation Site: 2    Verified By: Sundra Aland, M.D., MD    20-Aug-14 16:14, Chest Portable Single View  Chest Portable Single View  REASON FOR EXAM:    Chest pain  COMMENTS:       PROCEDURE: DXR - DXR PORTABLE CHEST SINGLE VIEW  - Sep 03 2012  4:14PM     RESULT: Comparison is made to the exam of 02/06/2012. The heart is mildly   enlarged. There is mild prominence of the interstitial markings which   could be secondary to the underexposed nature of the film. The lung bases   are poorly penetrated. There is no definite infiltrate, edema, effusion   or pneumothorax. The cardiac silhouette is enlarged. Followup PA and   lateral views of the chest are recommended when the patient's condition   permits.    IMPRESSION:   1. Stable cardiomegaly.  Dictation Site: 6        Verified By: Sundra Aland, M.D., MD    25-Aug-14 10:56, KUB - Kidney Ureter Bladder  KUB - Kidney Ureter Bladder  REASON FOR EXAM:    indigestion, abd discomfort  COMMENTS:       PROCEDURE: DXR - DXR KIDNEY URETER BLADDER  - Sep 08 2012 10:56AM     RESULT: Comparisons:  None    Findings:      Supine radiograph of the abdomen is provided.    There is a nonspecific bowel gas pattern. There is no bowel dilatation to   suggest obstruction. There is no pathologic calcification along the   expected course of the ureters. There is no evidence of pneumoperitoneum,   portal venous gas, or pneumatosis.  The osseous structures are unremarkable.    IMPRESSION:     Unremarkable KUB.    Dictation Site: 1        Verified By: Jennette Banker, M.D., MD  [place]:    20-Aug-14 19:20, [place] Color Flow Doppler Low Extrem Left (Leg)  [place] Color Flow Doppler Low Extrem Left (Leg)  REASON  FOR EXAM:    ro DVT, swelling pain  COMMENTS:       PROCEDURE: [place]  - US DOPPLER LOW EXTR LEFT  - Sep 03 2012  7:20PM     RESULT: Technique: Pearline Cables scale, Duplex color  flow doppler, and spectral   waveform imaging was performed of the deep venous structures of the LEFT   lower extremity. This study is markedly limited secondary to patient's   morbid obesity of 420+ pounds.    Findings: There is no evidence of increased echogenicity,   non-compressibility, abnormal waveform, abnormal grayscale, nor abnormal   color flow with in the common femoral  and popliteal veins of the LEFT   lower extremity. Due to patient's body habitus, morbid obesity of 420   pounds plus the evaluation of the superficial femoral vein is limited and     can only exclude nonocclusive thrombus. There is appropriate response to   Valsalva and augmentation within the common femoral and popliteal veins.    IMPRESSION:     1. No sonographic evidence of a deep venous thrombus within the common   femoral and poplitealveins. There is no sonographic evidence of   occlusive thrombus within  superficial femoral vein. The study is limited   and degraded as described above. If there is clinical concern of deep   venous thrombus treatment with anticoagulant therapy is recommended.        Verified By: Mikki Santee, M.D., MD    06-Dec-14 05:05, [place] Color Flow Doppler Lower Extrem Right (Leg)  [place] Color Flow Doppler Lower Extrem Right (Leg)  REASON FOR EXAM:    Swelling  COMMENTS:       PROCEDURE: [place]  - US DOPPLER LOW EXTR RIGHT  - Dec 20 2012  5:05AM     CLINICAL DATA:  Right leg swelling, weeping and erythema.    EXAM:  Right LOWER EXTREMITY VENOUS DOPPLER ULTRASOUND    TECHNIQUE:  Gray-scale sonography with graded compression, as well as color  Doppler and duplex ultrasound, were performed to evaluate the deep  venous system from the level of the common femoral vein through the  popliteal veins. The calf veins  were not visualized on this study.  Spectral Doppler was utilized to evaluate flow at rest and with  distal augmentation maneuvers.    COMPARISON:  None.    FINDINGS:  Thrombus within deep veins:  None visualized.    Compressibility of deep veins:  Normal.    Duplex waveform respiratory phasicity:  Normal.    Duplex waveform response to augmentation:  Normal.    Venous reflux:  None visualized.  Other findings: Mild diffuse soft tissue swelling is noted, with  areas of mild soft tissue edema characterized on ultrasound.     IMPRESSION:  No evidence for deep venous thrombosis.      Electronically Signed    By: Garald Balding M.D.    On: 12/20/2012 05:11         Verified By: JEFFREY . Radene Knee, M.D.,  LabUnknown:    22-Jan-14 22:50, Chest Portable Single View  PACS Image    20-Aug-14 16:14, Chest Portable Single View  PACS Image    20-Aug-14 19:20, [place] Color Flow Doppler Low Extrem Left (Leg)  PACS Image    25-Aug-14 10:56, KUB - Kidney Ureter Bladder  PACS Image    06-Dec-14 05:05, [place] Color Flow Doppler Lower Extrem Right (Leg)  PACS Image    08-Dec-14 12:47, GI Blood Loss Study - Nuc Med  PACS Image  Nuclear Med:  GI Blood Loss Study - Nuc Med  REASON FOR EXAM:    rectal bleed  COMMENTS:       PROCEDURE: NM  - NM GI BLOOD LOSS STUDY  - Dec 22 2012 12:47PM     CLINICAL DATA:  Hematochezia    EXAM:  NUCLEAR MEDICINE GASTROINTESTINAL BLEEDING SCAN    TECHNIQUE:  Sequential abdominal images were obtained following intravenous  administration of Tc-59mlabeled red blood cells.    COMPARISON:  Prior CT abdomen/pelvis 02/08/2010  RADIOPHARMACEUTICALS:  24.960m Tc-9953m-vitro labeled red cells.    FINDINGS:  Abnormal accumulation of radiotracer within a hollow viscus in the  left mid abdomen with relatively rapid progression through multiple  loops of bowel. The appearance is most consistent with small bowel  hemorrhage. There appears to be some uptake close to  the upper  midline. Query a proximal (duodenum) GI source.     IMPRESSION:  Positive tagged red blood cell bleeding study. The bleed appears to  be emanating from the upper small bowel (possibly duodenum).  Recommend consultation with Gastroenterology for upper endoscopy.    Critical Value/emergent results were called  by telephone at the time  of interpretation on 12/22/2012 at 1:42 PM to Dr. Hillary Bow , who  verbally acknowledged these results.      Electronically Signed    By: Jacqulynn Cadet M.D.    On: 12/22/2012 13:42         Verified By: Criselda Peaches, M.D.,   ASSESSMENT AND PLAN:  Assessment/Admission Diagnosis Upper GI bleed morbid obesity anticoagulated for atrial fibrillation cellulitis RLE   Plan To evaluate a source of bleeding a nuc med scan was done which I have independently reviewed.  This shows an upper GI bleeding source likely in the duodenum with multiple loops of small bowel filling after an initial focus was seen.   There is really no role for embolization for upper GI bleeding in the majority of cases, and I do not think this would be of benefit with this patient.   Would agree with FFP and reversal of anticoagulation and treatment for likely PUD. If bleeding does not stop with these measures, will likely need upper endoscopy. Will sign off, please call with questions.     level 4   Electronic Signatures: Algernon Huxley (MD)  (Signed 08-Dec-14 15:44)  Authored: Chief Complaint and History, PAST MEDICAL/SURGICAL HISTORY, ALLERGIES, HOME MEDICATIONS, Family and Social History, Review of Systems, Physical Exam, LABS, RADIOLOGY, Assessment and Plan   Last Updated: 08-Dec-14 15:44 by Algernon Huxley (MD)

## 2014-05-07 NOTE — Consult Note (Signed)
General Aspect The patient is a pleasant 49 year old male who presented to [facility] with right lower extremity cellulitis that was quite severe. The patient was started on antibiotic therapy. Unfortunately, his hospital course was complicated by lower GI bleed. Hemoglobin upon presentation was 11.3, but has fallen as low as 7.2. He is being followed by gastroenterology for this issue. He has now developed acute renal failure likely as a result of hypotension from anemia of blood loss as well as elevated vancomycin level. Creatinine was 0.97 upon presentation. Creatinine is now up to 2.7 with a serum potassium of 6.0 and serum bicarbonate of 14. He has become oliguric over the past 12 hours. Nephrology has recommended begining CRT.     PAST MEDICAL HISTORY:  1.  Coronary artery disease, status post MI.  2.  Ischemic cardiomyopathy.  3.  Chronic systolic heart failure, ejection fraction 25%.  4.  Chronic atrial fibrillation.  5.  Obstructive sleep apnea.  6.  Morbid obesity.  7.  Hypertension.  8.  Hyperlipidemia.  9.  Chronic lower extremity edema.   PAST SURGICAL HISTORY: Tonsillectomy with history of uvulopalatopharyngoplasty for his sleep apnea.   Home Medications: Medication Instructions Status  amiodarone 100 mg oral tablet 1 tab(s) orally 2 times a day Active  metoprolol 100 mg oral tablet 1 tab(s) orally 2 times a day Active  digoxin 125 mcg (0.125 mg) oral tablet 1 tab(s) orally once a day Active  lisinopril 5 mg oral tablet 1 tab(s) orally once a day Active  Mag-Ox 400 oral tablet 1 tab(s) orally 2 times a day Active  Lasix 40 mg oral tablet 1 tab(s) orally 2 times a day Active  Coumadin 10 mg oral tablet 1 tab(s) orally once a day Active  omeprazole 20 mg oral delayed release tablet 1 tab(s) orally once a day Active  Coumadin 1 mg oral tablet 1 tab(s) orally once a day, take with 10 mg for total dose of 11 mg daily Active    Bee Stings: Other  Case  History:  Family History Non-Contributory   Social History negative tobacco, negative ETOH, negative Illicit drugs   Review of Systems:  Fever/Chills Yes   Cough No   Sputum No   Abdominal Pain No   Diarrhea No   Constipation No   Nausea/Vomiting Yes   SOB/DOE No   Chest Pain No   Telemetry Reviewed NSR   Dysuria No   Physical Exam:  GEN well developed, well nourished   HEENT pale conjunctivae, hearing intact to voice, moist oral mucosa   NECK supple  trachea midline   RESP normal resp effort  no use of accessory muscles   CARD regular rate  no JVD   ABD denies tenderness  nondistended, obese   GU foley catheter in place  clear yellow urine draining   EXTR positive edema, massive chronic lymphedema, erythema and tenderness of the right leg associated with drainage and blisters   SKIN positive rashes, positive ulcers, skin turgor poor   NEURO cranial nerves intact, follows commands   PSYCH alert, A+O to time, place, person   Nursing/Ancillary Notes: **Vital Signs.:   09-Dec-14 12:30  Oxygen Delivery 2L    17:00  Vital Signs Type Routine  Temperature Source axillary  Pulse Pulse 96  Pulse source if not from Vital Sign Device per cardiac monitor  Systolic BP Systolic BP 161  Diastolic BP (mmHg) Diastolic BP (mmHg) 70  Mean BP 89  Pulse Ox %  Pulse Ox % 100  Oxygen Delivery 2L  Pulse Ox Heart Rate 82  Telemetry pattern Cardiac Rhythm Atrial fibrillation   TDMs:  09-Dec-14 04:46   Vancomycin, Trough LAB  42  Routine Chem:  09-Dec-14 03:09   Glucose, Serum  144  BUN  35  Creatinine (comp)  2.77  Sodium, Serum 136  Potassium, Serum  6.0  Chloride, Serum 103  CO2, Serum  14  Calcium (Total), Serum  7.6  Anion Gap  19  Osmolality (calc) 282  eGFR (African American)  30  eGFR (Non-African American)  26 (eGFR values <56m/min/1.73 m2 may be an indication of chronic kidney disease (CKD). Calculated eGFR is useful in patients with stable  renal function. The eGFR calculation will not be reliable in acutely ill patients when serum creatinine is changing rapidly. It is not useful in  patients on dialysis. The eGFR calculation may not be applicable to patients at the low and high extremes of body sizes, pregnant women, and vegetarians.)  Result Comment DIFFERENTIAL - AUTOMATED DIFFERENTIAL VERIFIED BY  - MANUAL DIFFERENTIAL. RESULTS ARE  - CONSISTENT...TPL  Result(s) reported on 23 Dec 2012 at 04:45AM.    04:46   Result Comment T VANCOMYCIN - NOTIFIED OF CRITICAL VALUE  - RESULTS VERIFIED BY REPEAT TESTING.  - CALLED TO NICKOLA GLOGOVAC AT [id number]  - 12/23/12...TPL  - READ-BACK PROCESS PERFORMED.  Result(s) reported on 23 Dec 2012 at 06:18AM.  Magnesium, Serum  1.7 (1.8-2.4 THERAPEUTIC RANGE: 4-7 mg/dL TOXIC: > 10 mg/dL  -----------------------)  Phosphorus, Serum  5.9 (Result(s) reported on 23 Dec 2012 at 12:03PM.)    15:39   Glucose, Serum  149  BUN  44  Creatinine (comp)  2.94  Sodium, Serum  135  Potassium, Serum 4.5  Chloride, Serum 107  CO2, Serum 24  Calcium (Total), Serum  7.1  Anion Gap  4  Osmolality (calc) 284  eGFR (African American)  28  eGFR (Non-African American)  24 (eGFR values <66mmin/1.73 m2 may be an indication of chronic kidney disease (CKD). Calculated eGFR is useful in patients with stable renal function. The eGFR calculation will not be reliable in acutely ill patients when serum creatinine is changing rapidly. It is not useful in  patients on dialysis. The eGFR calculation may not be applicable to patients at the low and high extremes of body sizes, pregnant women, and vegetarians.)  Result Comment CALCIUM - READ-BACK PROCESS PERFORMED.  Result(s) reported on 23 Dec 2012 at 05:13PM.    20:03   Glucose, Serum -  BUN -  Creatinine (comp) -  Sodium, Serum -  Potassium, Serum -  Chloride, Serum -  CO2, Serum -  Calcium (Total), Serum -  Anion Gap -  Osmolality (calc) -  eGFR  (African American) -  eGFR (Non-African American) - (eGFR values <6012min/1.73 m2 may be an indication of chronic kidney disease (CKD). Calculated eGFR is useful in patients with stable renal function. The eGFR calculation will not be reliable in acutely ill patients when serum creatinine is changing rapidly. It is not useful in  patients on dialysis. The eGFR calculation may not be applicable to patients at the low and high extremes of body sizes, pregnant women, and vegetarians.)  Result Comment MET B - CANCELLED DUE TO POSSIBLE IV  - CONTAMINATION PER KARLA QUINCER. NEW  - SPECIMEN RESULT FOR GLUCOSE IS 133. LABS - This specimen was collected through an   - indwelling catheter or arterial line.  - A minimum  of 34ms of blood was wasted prior    - to collecting the sample.  Interpret  - results with caution.  Result(s) reported on 26 Dec 2012 at 10:57AM.    20:58   Glucose, Serum  133  BUN  49  Creatinine (comp)  3.17  Sodium, Serum 137  Potassium, Serum 4.8  Chloride, Serum 103  CO2, Serum 24  Calcium (Total), Serum  8.2  Anion Gap 10  Osmolality (calc) 289  eGFR (African American)  26  eGFR (Non-African American)  22 (eGFR values <6610mmin/1.73 m2 may be an indication of chronic kidney disease (CKD). Calculated eGFR is useful in patients with stable renal function. The eGFR calculation will not be reliable in acutely ill patients when serum creatinine is changing rapidly. It is not useful in  patients on dialysis. The eGFR calculation may not be applicable to patients at the low and high extremes of body sizes, pregnant women, and vegetarians.)  Result Comment LABS - This specimen was collected through an   - indwelling catheter or arterial line.  - A minimum of 10m19mof blood was wasted prior    - to collecting the sample.  Interpret  - results with caution.  Result(s) reported on 23 Dec 2012 at 09:29PM.    22:05   Glucose, Serum  142  BUN  47  Creatinine (comp)   3.09  Sodium, Serum 137  Potassium, Serum 4.6  Chloride, Serum 104  CO2, Serum 24  Calcium (Total), Serum  8.3  Anion Gap 9  Osmolality (calc) 288  eGFR (African American)  27  eGFR (Non-African American)  23 (eGFR values <51m46mn/1.73 m2 may be an indication of chronic kidney disease (CKD). Calculated eGFR is useful in patients with stable renal function. The eGFR calculation will not be reliable in acutely ill patients when serum creatinine is changing rapidly. It is not useful in  patients on dialysis. The eGFR calculation may not be applicable to patients at the low and high extremes of body sizes, pregnant women, and vegetarians.)  Result Comment LABS - This specimen was collected through an   - indwelling catheter or arterial line.  - A minimum of 10mls32m blood was wasted prior    - to collecting the sample.  Interpret  - results with caution.  Result(s) reported on 23 Dec 2012 at 10:47PM.    23:20   Glucose, Serum  139  BUN  45  Creatinine (comp)  3.01  Sodium, Serum 136  Potassium, Serum 4.7  Chloride, Serum 103  CO2, Serum 26  Calcium (Total), Serum  8.2  Anion Gap 7  Osmolality (calc) 286  eGFR (African American)  27  eGFR (Non-African American)  24 (eGFR values <51mL/40m1.73 m2 may be an indication of chronic kidney disease (CKD). Calculated eGFR is useful in patients with stable renal function. The eGFR calculation will not be reliable in acutely ill patients when serum creatinine is changing rapidly. It is not useful in  patients on dialysis. The eGFR calculation may not be applicable to patients at the low and high extremes of body sizes, pregnant women, and vegetarians.)  Routine Coag:  09-Dec-14 04:45   Prothrombin  23.0  INR 2.1 (INR reference interval applies to patients on anticoagulant therapy. A single INR therapeutic range for coumarins is not optimal for all indications; however, the suggested range for most indications is 2.0 -  3.0. Exceptions to the INR Reference Range may include: Prosthetic heart valves, acute myocardial infarction, prevention of  myocardial infarction, and combinations of aspirin and anticoagulant. The need for a higher or lower target INR must be assessed individually. Reference: The Pharmacology and Management of the Vitamin K  antagonists: the seventh ACCP Conference on Antithrombotic and Thrombolytic Therapy. [id number] Sept:126 (3suppl): [id number]. A HCT value >55% may artifactually increase the PT.  In one study,  the increase was an average of 25%. Reference:  "Effect on Routine and Special Coagulation Testing Values of Citrate Anticoagulant Adjustment in Patients with High HCT Values." American Journal of Clinical Pathology 2006;126:400-405.)    15:39   Prothrombin  17.8  INR 1.5 (INR reference interval applies to patients on anticoagulant therapy. A single INR therapeutic range for coumarins is not optimal for all indications; however, the suggested range for most indications is 2.0 - 3.0. Exceptions to the INR Reference Range may include: Prosthetic heart valves, acute myocardial infarction, prevention of myocardial infarction, and combinations of aspirin and anticoagulant. The need for a higher or lower target INR must be assessed individually. Reference: The Pharmacology and Management of the Vitamin K  antagonists: the seventh ACCP Conference on Antithrombotic and Thrombolytic Therapy. [id number] Sept:126 (3suppl): [id number]. A HCT value >55% may artifactually increase the PT.  In one study,  the increase was an average of 25%. Reference:  "Effect on Routine and Special Coagulation Testing Values of Citrate Anticoagulant Adjustment in Patients with High HCT Values." American Journal of Clinical Pathology 2006;126:400-405.)  Routine Hem:  09-Dec-14 03:09   Hemoglobin (CBC)  7.2  WBC (CBC)  35.7  RBC (CBC)  2.73  Hematocrit (CBC)  23.5  Platelet Count (CBC) 320  MCV 86   MCH 26.3  MCHC  30.6  RDW  15.5  Neutrophil % 84.1  Lymphocyte % 6.5  Monocyte % 8.2  Eosinophil % 0.1  Basophil % 1.1  Neutrophil #  30.0  Lymphocyte # 2.3  Monocyte #  2.9  Eosinophil # 0.0  Basophil #  0.4    15:39   Hemoglobin (CBC)  6.1  WBC (CBC)  20.0  RBC (CBC)  2.24  Hematocrit (CBC)  18.8  Platelet Count (CBC) 226 (Result(s) reported on 23 Dec 2012 at 03:53PM.)  MCV 84  MCH 27.4  MCHC 32.6  RDW  15.6  Bands 6  Segmented Neutrophils 87  Lymphocytes 3  Monocytes 1  Basophil 1  Metamyelocyte 1  Diff Comment 2 ANISOCYTOSIS  Diff Comment 3 POIKILOCYTOSIS  Diff Comment 4 POLYCHROMASIA  Myelocyte 1  Diff Comment 5 PLTS VARIED IN SIZE  Result(s) reported on 23 Dec 2012 at 03:53PM.    17:35   Hemoglobin (CBC)  6.9 (Result(s) reported on 23 Dec 2012 at 06:00PM.)    23:20   Hemoglobin (CBC)  7.9 (Result(s) reported on 24 Dec 2012 at 12:08AM.)    Impression 1.  acute renal failure        will place temp catheter 2.  cellulitis with lymphedema          continue abx and will wrap leg in unna boot when patient begins sitting up and ambulating.   Electronic Signatures: Hortencia Pilar (MD)  (Signed 14-Dec-14 13:44)  Authored: General Aspect/Present Illness, Home Medications, Allergies, History and Physical Exam, Vital Signs, Labs, Impression/Plan   Last Updated: 14-Dec-14 13:44 by Hortencia Pilar (MD)

## 2014-05-07 NOTE — Consult Note (Signed)
Chief Complaint:  Subjective/Chief Complaint Patient without evidence of recurrent bleeding over the course of the day and since yesterday afternoon.  Coagulopathy reversed, hemodynamically stable,  HGB 6.1 on recheck this afternoon.  In light of patient cardiac history and development of renal failure, will give 2 units of prbc slowly over the course of the evening and early am.  Will hold sedated proceedure/ EGD until this is checked in am.  Discussed with anesthesia.  Can proceed more urgently if needed.   VITAL SIGNS/ANCILLARY NOTES:  **Vital Signs.:   09-Dec-14 14:42  Vital Signs Type Blood Transfusion Complete  Temperature Temperature (F) 98.2  Celsius 36.7  Temperature Source axillary  Pulse Pulse 113  Pulse source if not from Vital Sign Device per cardiac monitor  Respirations Respirations 19  Systolic BP Systolic BP 144  Diastolic BP (mmHg) Diastolic BP (mmHg) 71  Mean BP 88  BP Source  if not from Vital Sign Device non-invasive  Pulse Ox % Pulse Ox % 100  Oxygen Delivery 2L    15:00  Vital Signs Type Routine  Temperature Source axillary  Pulse Pulse 96  Pulse source if not from Vital Sign Device per cardiac monitor  Systolic BP Systolic BP 818  Diastolic BP (mmHg) Diastolic BP (mmHg) 44  Mean BP 81  Pulse Ox % Pulse Ox % 100  Oxygen Delivery 2L  Pulse Ox Heart Rate 80  Telemetry pattern Cardiac Rhythm Atrial fibrillation   Lab Results:  Routine Chem:  09-Dec-14 15:39   Glucose, Serum  149  BUN  44  Creatinine (comp)  2.94  Sodium, Serum  135  Potassium, Serum 4.5  Chloride, Serum 107  CO2, Serum 24  Calcium (Total), Serum  7.1  Anion Gap  4  Osmolality (calc) 284  eGFR (African American)  28  eGFR (Non-African American)  24 (eGFR values <16m/min/1.73 m2 may be an indication of chronic kidney disease (CKD). Calculated eGFR is useful in patients with stable renal function. The eGFR calculation will not be reliable in acutely ill patients when serum  creatinine is changing rapidly. It is not useful in  patients on dialysis. The eGFR calculation may not be applicable to patients at the low and high extremes of body sizes, pregnant women, and vegetarians.)  Routine Coag:  06-Dec-14 02:08   INR 2.1 (INR reference interval applies to patients on anticoagulant therapy. A single INR therapeutic range for coumarins is not optimal for all indications; however, the suggested range for most indications is 2.0 - 3.0. Exceptions to the INR Reference Range may include: Prosthetic heart valves, acute myocardial infarction, prevention of myocardial infarction, and combinations of aspirin and anticoagulant. The need for a higher or lower target INR must be assessed individually. Reference: The Pharmacology and Management of the Vitamin K  antagonists: the seventh ACCP Conference on Antithrombotic and Thrombolytic Therapy. [id number]:126 (3suppl): [id number] A HCT value >55% may artifactually increase the PT.  In one study,  the increase was an average of 25%. Reference:  "Effect on Routine and Special Coagulation Testing Values of Citrate Anticoagulant Adjustment in Patients with High HCT Values." American Journal of Clinical Pathology 2006;126:400-405.)  07-Dec-14 04:55   INR 2.2 (INR reference interval applies to patients on anticoagulant therapy. A single INR therapeutic range for coumarins is not optimal for all indications; however, the suggested range for most indications is 2.0 - 3.0. Exceptions to the INR Reference Range may include: Prosthetic heart valves, acute myocardial infarction, prevention of myocardial infarction, and combinations  of aspirin and anticoagulant. The need for a higher or lower target INR must be assessed individually. Reference: The Pharmacology and Management of the Vitamin K  antagonists: the seventh ACCP Conference on Antithrombotic and Thrombolytic Therapy. [id number] Sept:126 (3suppl): [id number]. A HCT  value >55% may artifactually increase the PT.  In one study,  the increase was an average of 25%. Reference:  "Effect on Routine and Special Coagulation Testing Values of Citrate Anticoagulant Adjustment in Patients with High HCT Values." American Journal of Clinical Pathology 2006;126:400-405.)  08-Dec-14 04:53   INR 2.5 (INR reference interval applies to patients on anticoagulant therapy. A single INR therapeutic range for coumarins is not optimal for all indications; however, the suggested range for most indications is 2.0 - 3.0. Exceptions to the INR Reference Range may include: Prosthetic heart valves, acute myocardial infarction, prevention of myocardial infarction, and combinations of aspirin and anticoagulant. The need for a higher or lower target INR must be assessed individually. Reference: The Pharmacology and Management of the Vitamin K  antagonists: the seventh ACCP Conference on Antithrombotic and Thrombolytic Therapy. [id number] Sept:126 (3suppl): [id number]. A HCT value >55% may artifactually increase the PT.  In one study,  the increase was an average of 25%. Reference:  "Effect on Routine and Special Coagulation Testing Values of Citrate Anticoagulant Adjustment in Patients with High HCT Values." American Journal of Clinical Pathology 2006;126:400-405.)    21:33   INR 2.2 (INR reference interval applies to patients on anticoagulant therapy. A single INR therapeutic range for coumarins is not optimal for all indications; however, the suggested range for most indications is 2.0 - 3.0. Exceptions to the INR Reference Range may include: Prosthetic heart valves, acute myocardial infarction, prevention of myocardial infarction, and combinations of aspirin and anticoagulant. The need for a higher or lower target INR must be assessed individually. Reference: The Pharmacology and Management of the Vitamin K  antagonists: the seventh ACCP Conference on Antithrombotic  and Thrombolytic Therapy. [id number] Sept:126 (3suppl): [id number]. A HCT value >55% may artifactually increase the PT.  In one study,  the increase was an average of 25%. Reference:  "Effect on Routine and Special Coagulation Testing Values of Citrate Anticoagulant Adjustment in Patients with High HCT Values." American Journal of Clinical Pathology 2006;126:400-405.)  09-Dec-14 04:45   INR 2.1 (INR reference interval applies to patients on anticoagulant therapy. A single INR therapeutic range for coumarins is not optimal for all indications; however, the suggested range for most indications is 2.0 - 3.0. Exceptions to the INR Reference Range may include: Prosthetic heart valves, acute myocardial infarction, prevention of myocardial infarction, and combinations of aspirin and anticoagulant. The need for a higher or lower target INR must be assessed individually. Reference: The Pharmacology and Management of the Vitamin K  antagonists: the seventh ACCP Conference on Antithrombotic and Thrombolytic Therapy. [id number] Sept:126 (3suppl): [id number]. A HCT value >55% may artifactually increase the PT.  In one study,  the increase was an average of 25%. Reference:  "Effect on Routine and Special Coagulation Testing Values of Citrate Anticoagulant Adjustment in Patients with High HCT Values." American Journal of Clinical Pathology 2006;126:400-405.)    15:39   Prothrombin  17.8  INR 1.5 (INR reference interval applies to patients on anticoagulant therapy. A single INR therapeutic range for coumarins is not optimal for all indications; however, the suggested range for most indications is 2.0 - 3.0. Exceptions to the INR Reference Range may include: Prosthetic heart valves, acute myocardial infarction, prevention of myocardial infarction, and combinations  of aspirin and anticoagulant. The need for a higher or lower target INR must be assessed individually. Reference: The Pharmacology and  Management of the Vitamin K  antagonists: the seventh ACCP Conference on Antithrombotic and Thrombolytic Therapy. [id number] Sept:126 (3suppl): [id number]. A HCT value >55% may artifactually increase the PT.  In one study,  the increase was an average of 25%. Reference:  "Effect on Routine and Special Coagulation Testing Values of Citrate Anticoagulant Adjustment in Patients with High HCT Values." American Journal of Clinical Pathology 2006;126:400-405.)  Routine Hem:  09-Dec-14 15:39   WBC (CBC)  20.0  RBC (CBC)  2.24  Hemoglobin (CBC)  6.1  Hematocrit (CBC)  18.8  Platelet Count (CBC) 226 (Result(s) reported on 23 Dec 2012 at 03:53PM.)  MCV 84  MCH 27.4  MCHC 32.6  RDW  15.6   Electronic Signatures: Loistine Simas (MD)  (Signed 09-Dec-14 16:33)  Authored: Chief Complaint, VITAL SIGNS/ANCILLARY NOTES, Lab Results   Last Updated: 09-Dec-14 16:33 by Loistine Simas (MD)

## 2014-05-07 NOTE — Consult Note (Signed)
GI follow up: compalints currently. No bleeding in several days.  No abd pain, n/v  obese, nabs, softCTA, no w/creg, no m/r/g  7.6 , stable.   bleeding since cautery of duodenal bulb ulcersok to change protonix to 40 BID POcont carafate 1 gr q6hh pylori is negative. no further recs at this time.   Electronic Signatures: Dow Adolphein, Matthew (MD)  (Signed on 18-Dec-14 16:39)  Authored  Last Updated: 18-Dec-14 16:39 by Dow Adolphein, Matthew (MD)

## 2014-05-07 NOTE — Consult Note (Signed)
Called about Dr. Teddy Spikeein's patient with duodenal ulcer with clean base. Had not bled for few days until this afternoon with drop in BP. Being trasferred back to ICU. Agree with NPO/ aggressive IVF. Place back on protonix iv. Agree with blood transfusions. If bleeding does not stop, will see patient tonight and rescope patient. Will follow. Thanks.  Electronic Signatures: Lutricia Feilh, Bryanah Sidell (MD)  (Signed on 13-Dec-14 16:21)  Authored  Last Updated: 13-Dec-14 16:21 by Lutricia Feilh, Oriya Kettering (MD)

## 2014-05-07 NOTE — Consult Note (Signed)
GI follow up. EGD yesterday with coagulation of oozing duod bulb ulcer. further bleeding today that pt is aware of. stable.    PPI drip for nowcont clears for nowcarafate 1 gr TIDno further recs curently.   Electronic Signatures: Dow Adolphein, Matthew (MD)  (Signed on 15-Dec-14 17:20)  Authored  Last Updated: 15-Dec-14 17:20 by Dow Adolphein, Matthew (MD)

## 2014-05-08 NOTE — Op Note (Signed)
PATIENT NAME:  Gerald Roth, [name] MR#:  [id number] DATE OF BIRTH:  May 06, 1965  DATE OF PROCEDURE:  12/24/2012  PREOPERATIVE DIAGNOSES:  1. Acute renal failure.  2. Cellulitis right lower extremity.   POSTOPERATIVE DIAGNOSES:  1. Acute renal failure.  2. Cellulitis right lower extremity.   PROCEDURE PERFORMED: Insertion of right internal jugular double-lumen temporary dialysis catheter.   SURGEON: Renford DillsGregory G. Schnier, M.D.   INDICATION FOR PROCEDURE: The patient is in the intensive care unit. He is critically ill and requires hemodialysis. He is, therefore, undergoing placement of a catheter to initiate his CRT. Risks and benefits were reviewed. The patient agrees to proceed.   DESCRIPTION OF PROCEDURE: The patient is positioned supine. His right neck is prepped and draped in a sterile fashion. Lidocaine 1% is infiltrated into the soft tissues. Ultrasound is placed in a sterile sleeve. Jugular vein is identified. It is echolucent and compressible, indicating patency. Image is recorded for the permanent record. Under direct ultrasound visualization, the jugular vein is accessed with a Seldinger needle. A J-wire is then advanced without difficulty, followed by a counterincision. Dilator is passed over the wire and the catheter advanced without difficulty. Both lumens aspirate and flush easily. It is secured to the skin of the neck with 2-0 nylon, and a sterile dressing with a Biopatch is applied. The patient tolerated the procedure well, and there were no immediate complications.   ____________________________ Renford DillsGregory G. Schnier, MD ggs:gb D: 12/24/2012 17:57:34 ET T: 12/24/2012 20:30:18 ET JOB#: [id number]  cc: Renford DillsGregory G. Schnier, MD, <Dictator> Renford DillsGREGORY G SCHNIER MD ELECTRONICALLY SIGNED 01/20/2013 19:29

## 2014-05-08 NOTE — Op Note (Signed)
PATIENT NAME:  Gerald Roth, Gerald Roth MR#:  [id number] DATE OF BIRTH:  04/09/1965  DATE OF PROCEDURE:  12/23/2012  PREOPERATIVE DIAGNOSES:   1.  Right leg cellulitis. 2.  Acute renal failure.  POSTOPERATIVE DIAGNOSES:   1.  Right leg cellulitis. 2.  Acute renal failure.  PROCEDURES:  1. Ultrasound guidance for vascular access to right brachial vein.  2. Fluoroscopic guidance for placement of catheter.  3. Insertion of triple lumen peripherally inserted central venous catheter, right arm.  SURGEON: Levora DredgeGregory Schnier, MD  ANESTHESIA: Local.   ESTIMATED BLOOD LOSS: Minimal.   INDICATION FOR PROCEDURE: Requiring IV antibiotics, greater than 5 days.  DESCRIPTION OF PROCEDURE: The patient's right arm was sterilely prepped and draped, and a sterile surgical field was created. The brachial vein was accessed under direct ultrasound guidance without difficulty with a micropuncture needle and permanent image was recorded. 0.018 wire was then placed into the superior vena cava. Peel-away sheath was placed over the wire. A single lumen peripherally inserted central venous catheter was then placed over the wire and the wire and peel-away sheath were removed. The catheter tip was placed into the superior vena cava and was secured at the skin at 40 cm with a sterile dressing. The catheter withdrew blood well and flushed easily with heparinized saline. The patient tolerated procedure well.    ____________________________ Renford DillsGregory G. Schnier, MD ggs:dmm D: 12/26/2012 18:10:46 ET T: 12/26/2012 19:18:52 ET JOB#: [id number]  cc: Renford DillsGregory G. Schnier, MD, <Dictator> Renford DillsGREGORY G SCHNIER MD ELECTRONICALLY SIGNED 01/20/2013 19:29

## 2014-05-08 NOTE — Consult Note (Signed)
PATIENT NAME:  Gerald Roth, Gerald Roth MR#:  [id number] DATE OF BIRTH:  1965-08-21  DATE OF CONSULTATION:  12/23/2012  REFERRING PHYSICIAN: Dr. Elpidio Anis  CONSULTING PHYSICIAN:  Katianna Mcclenney Lizabeth Leyden, MD  REASON FOR CONSULTATION: Acute renal failure and hyperkalemia in the setting of anemia of blood loss.    HISTORY OF PRESENT ILLNESS: The patient is a pleasant 49 year old Caucasian male with past medical history of coronary artery disease, status post MI, ischemic cardiomyopathy, ejection fraction 25%, chronic atrial fibrillation on anticoagulation, obstructive sleep apnea, morbid obesity, hypertension, hyperlipidemia, chronic lower extremity edema, who presented to [facility] with right lower extremity cellulitis that was quite severe. The patient was started on broad-spectrum antibiotic therapy upon presentation. Unfortunately, his hospital course was complicated by lower GI bleed. Hemoglobin upon presentation was 11.3, but has fallen as low as 7.2. He is being followed by gastroenterology for this issue. He has now developed acute renal failure likely as a result of hypotension from anemia of blood loss as well as elevated vancomycin level. Creatinine was 0.97 upon presentation. Creatinine is now up to 2.7 with a serum potassium of 6.0 and serum bicarbonate of 14. He has become oliguric over the past 12 hours. We talked at length about the potential need for renal placement therapy and the patient is agreeable to this.   PAST MEDICAL HISTORY:  1.  Coronary artery disease, status post MI.  2.  Ischemic cardiomyopathy.  3.  Chronic systolic heart failure, ejection fraction 25%.  4.  Chronic atrial fibrillation.  5.  Obstructive sleep apnea.  6.  Morbid obesity.  7.  Hypertension.  8.  Hyperlipidemia.  9.  Chronic lower extremity edema.   PAST SURGICAL HISTORY: Tonsillectomy with history of uvulopalatopharyngoplasty for his sleep apnea.   ALLERGIES: BEE STINGS.   CURRENT  INPATIENT MEDICATIONS: Include diltiazem drip, pantoprazole drip 0.9 mL at 100 mL per hour, ceftriaxone 2 grams IV q.24 hours, morphine 2 mg IV every 4 hours p.r.n. pain, metoprolol 2.5 mg IV q.6 hours p.r.n. and Zofran 4 mg IV q.4 hours p.r.n.   SOCIAL HISTORY: The patient resides in [place]. He is unmarried. He has a son who is 55 years old. He quit tobacco about a year ago. Drinks alcohol primarily on the weekends. Denies illicit drug use. Works as a Engineer, manufacturing systems.   FAMILY HISTORY: Mother deceased and had history of ovarian cancer. Father living and has history of diabetes mellitus. No family history of end-stage renal disease.   REVIEW OF SYSTEMS: CONSTITUTIONAL: Denies fevers, chills or weight loss. EYES: Denies diplopia or blurry vision. HEENT: Denies headaches or hearing loss. Denies epistaxis.  CARDIOVASCULAR: Denies chest pain, palpitations or PND. RESPIRATORY: Denies cough, shortness of breath or hemoptysis. GASTROINTESTINAL: Has lower GI bleeding. Has had some nausea as well. GENITOURINARY: Denies frequency or urgency. Reports diminished urine output. MUSCULOSKELETAL: Has some pain in the right lower extremity. INTEGUMENTARY: Has significant redness in his right lower extremity. NEUROLOGIC: Denies focal numbness, weakness or tingling. PSYCHIATRIC: Denies depression or bipolar disorder. ENDOCRINE: Denies polyuria or polydipsia. HEMATOLOGIC/LYMPHATIC: Denies easy bruisability, bleeding or swollen lymph nodes. ALLERGY/IMMUNOLOGIC: Denies seasonal allergies or history of immunodeficiency.   PHYSICAL EXAMINATION:  VITAL SIGNS: Temperature 97.3, pulse 108, respirations 16, blood pressure 125/23, pulse oximetry 95% on 2 L.  GENERAL: Reveals a morbidly obese Caucasian male, who is seen in the Critical Care Unit.  HEENT: Normocephalic, atraumatic. Extraocular movements are intact. Pupils equal, round, and reactive to light. No scleral icterus. Conjunctivae are  pale. No epistaxis noted. Gross  hearing intact. Oral mucosa moist.  NECK: Supple without JVD or lymphadenopathy.  LUNGS: Demonstrate diminished breath sounds at the bases. Otherwise clear with normal respiratory effort.  HEART: S1, S2. The patient noted to be irregular. No murmurs or rubs are auscultated.  ABDOMEN: Obese, soft, nontender, nondistended. Bowel sounds positive. No rebound or guarding. No gross organomegaly appreciated though exam is degraded by obesity.  EXTREMITIES: There is significant right lower extremity erythema with area of ulceration medially. The wound is draining significantly. There is significant left lower extremity edema, but no redness at this time.  GENITOURINARY: No suprapubic tenderness noted at this time.  MUSCULOSKELETAL: Significant right lower extremity swelling noted as above, otherwise no other joint redness, swelling or tenderness.  SKIN: Shows chronic venous stasis changes in the left lower extremity and significant erythema in the right lower extremity.  PSYCHIATRIC: The patient with appropriate affect and appears to have good insight into his current illness.   LABORATORY DATA: Vancomycin level was 42. PT is 2.1. CBC shows WBC 35.7, hemoglobin 7.2, hematocrit 23, platelets of 320. BMP shows sodium 136, potassium 6, chloride 103, CO2 14, BUN 35, creatinine 2.7, glucose 144. Nuclear medicine GI blood loss study showed a positive tagged red blood cell bleeding study. The bleed appears to be emanating from the upper small bowel and possibly duodenum. Wound culture shows heavy growth of MRSA and Streptococcus. Urinalysis shows protein of 30 mg/dL, less than 1 RBC per high-power field, less than 1 WBC per high-power field. Blood cultures x 2 sets are negative.   IMPRESSION: This is a 49 year old Caucasian male with past medical history of coronary artery disease, status post myocardial infarction, cardiomyopathy with congestive heart failure with ejection fraction 45%, chronic atrial fibrillation,  obstructive sleep apnea, morbid obesity, hypertension, hyperlipidemia, chronic lower extremity edema, who [facility] with significant right lower extremity cellulitis with drainage and has now developed acute gastrointestinal bleed with anemia of blood loss as well as acute renal failure and hyperkalemia.   PROBLEM LIST:  1.  Acute renal failure, most likely secondary to acute tubular necrosis from anemia. blood loss and hypotension.  2.  Severe hyperkalemia, potassium 6.0.  3.  Metabolic acidosis.  4.  Hypotension.  5.  Severe right lower extremity cellulitis.  6.  Anemia of blood loss.  7.  Gastrointestinal bleed.   PLAN: The patient is critically ill at this point in time. We are asked to see him for acute renal failure and hyperkalemia urgently. Given the fact that he has developed oliguria, hyperkalemia and metabolic acidosis, we feel that renal replacement therapy is in order. We discussed risks, benefits, and alternatives to renal placement therapy with the patient and he has decided to proceed. We will consult with vascular surgery urgently for line placement. We will then begin continuous renal placement therapy with a blood flow rate of 350, dialysis flow rate of 2.5 L per hour and we will use a two potassium bath. We will monitor serial electrolyte panels. We will also obtain a renal ultrasound to exclude obstruction. Otherwise, would continue supportive care of GI bleed and give blood products as necessary and follow gastroenterology recommendations including continuation of Protonix drip. The patient also has active cardiovascular issues, which are being monitored by cardiology. Overall prognosis is guarded at this time. A total of 45 minutes of critical care time was delivered today and included coordination of care with specialists on the case.  ____________________________ Lennox Pippins, MD mnl:aw D: 12/23/2012 10:50:58 ET T: 12/23/2012 11:05:28  ET JOB#: [id number]  cc: Lennox Pippins, MD, <Dictator> Ria Comment Eddison Searls MD ELECTRONICALLY SIGNED 01/22/2013 11:26

## 2014-05-09 NOTE — H&P (Signed)
PATIENT NAME:  Gerald Roth, Gerald Roth MR#:  [id number] DATE OF BIRTH:  [birth year]  DATE OF ADMISSION:  02/24/2011  PRIMARY CARE PHYSICIAN: No primary care physician, follows at [facility]   CHIEF COMPLAINT: Palpitations and feeling like gas bubble in his stomach.   HISTORY OF PRESENT ILLNESS: Gerald Roth is a 49 year old Caucasian male with the past medical history of severe congestive cardiomyopathy, history of chronic atrial fibrillation, obstructive sleep apnea, systemic hypertension, morbid obesity, and noncompliance with medications. The patient states that he ran out of diltiazem for about a week. Consequently, in the last few days he felt palpitations, and his heart was racing until it reached the level that today he felt a gas-like bubble in his stomach with minimal shortness of breath but no chest pain. He decided to come to the Emergency Department for further evaluation. Here the patient was found to have uncontrolled atrial fibrillation with rapid ventricular rate reaching 167 per minute. The patient received intravenous diltiazem drip, and he is now being admitted to the Intensive Care Unit for further management. The patient denies having any chest pain. No syncope or near syncope. No abdominal pain. No vomiting. No diarrhea. No sweating.   REVIEW OF SYSTEMS: CONSTITUTIONAL: He denies having fatigue. No fever. No chills. No sputum production. EYES: Denies any blurring of vision. No double vision. ENT: No hearing impairment. No sore throat. No dysphagia. CARDIOVASCULAR: No chest pain or shortness of breath. He has peripheral edema. He denies syncope. He admits having significant palpitations. RESPIRATORY: No sputum production. No chest pain and no shortness of breath. GASTROINTESTINAL: No nausea, no vomiting. No abdominal pain other than the feeling distention-like feeling in the stomach. GENITOURINARY: No dysuria or frequency of urination. MUSCULOSKELETAL: No joint pain or swelling. No  muscular pain or swelling. INTEGUMENTARY: No skin rash. No ulcers. NEUROLOGY: No focal weakness. No seizure activity. No headache. PSYCHIATRY: No anxiety. No depression. ENDOCRINE: No night sweats. No heat or cold intolerance.   PAST MEDICAL HISTORY:  1. 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 His last echocardiogram was done in January of 2012 when he presented with atrial fibrillation with uncontrolled ventricular rate. His ejection fraction was 20 to 25%. There was a global hypokinesia, mild tricuspid and mild mitral regurgitation. Left atrial size was moderately dilated.   2. Atrial fibrillation, but he is not on anticoagulation. 3. Noncompliance with medications.  4. History of coronary artery disease, reporting a mild heart attack in 1998.  5. History of obstructive sleep apnea. 6. Systemic hypertension. 7. Hyperlipidemia. 8. Morbid obesity.    PAST SURGICAL HISTORY:  1. History of uvuloplasty for his sleep apnea.  2. History of tonsillectomy.   SOCIAL HABITS: He reports that he drinks beer, about a 12-pack over weekends only. He smokes cigarettes only lightly, 5 to 10 cigarettes last him about two weeks. No other drug abuse.  He is unemployed. He is single.   FAMILY HISTORY: He reports heart disease on the maternal side but does not specify what type.   ADMISSION MEDICATIONS:  1. Coreg CR 10 mg a day.  2. Lisinopril 5 mg a day.  3. Aspirin 325 mg a day.  4. Pravastatin 20 mg a day.  5. Hydrochlorothiazide 12.5 mg once a day.  6. Diltiazem 240 mg once a day.   ALLERGIES: No known drug allergies. However, he has a history of allergy to bee stings.   PHYSICAL EXAMINATION:  VITAL SIGNS: Blood pressure 122/88. His pulse was 167 per  minute. Right now it is down to 123 per minutes on IV diltiazem. Temperature 98.8. His oxygen saturation was 99% on room air.   GENERAL APPEARANCE: A young male, morbidly obese, lying down on a stretcher in no acute  distress.   HEENT: Head and neck: No pallor. No icterus. No cyanosis. Ears, nose and throat: Hearing was normal. Nasal mucosa, lips, tongue were normal. Eyes:  Normal eyelids and conjunctivae. Pupils are about 5 mm, equal and reactive to light.   NECK: Supple. Trachea at midline. No thyromegaly. No cervical lymphadenopathy.   HEART: Rapid irregular heartbeats. No murmur was appreciated. No carotid bruits.   LUNGS: Normal breathing pattern without use of accessory muscles. No rales. No wheezing.   ABDOMEN: Morbidly obese, soft without tenderness. No hepatosplenomegaly. No masses. No hernias.   MUSCULOSKELETAL: No joint swelling. No clubbing.   SKIN: No ulcers. No subcutaneous nodules. There is significant peripheral edema of the lower extremities extending from the ankle up to the knees, it is +3.   NEUROLOGICAL: Cranial nerves II through XII are intact. No focal motor deficit.   PSYCHIATRIC: The patient is alert and oriented x3. Mood and affect were normal.   LABORATORY, DIAGNOSTIC AND RADIOLOGICAL DATA:  EKG showed atrial fibrillation with rapid ventricular rate at 167 per minute. Ventricular premature complex versus aberrant conduction beat. There are nonspecific T wave abnormalities. Left axis deviation. Poor progression of R waves in the anterior chest leads. Incomplete right bundle branch block.  Serum glucose 140.  B-type natriuretic peptide was 3780.  BUN 20, creatinine 1.6. His baseline creatinine was 1.1 in January 2012. Serum sodium 144, potassium 3.7. His estimated GFR is 48. His liver function tests were normal.  His CPK was 94 with troponin 0.04.  CBC showed white count of 9000, hemoglobin 15, hematocrit 46, platelet count was 209.   ASSESSMENT:  1. Atrial fibrillation with rapid ventricular rate likely precipitated by noncompliance when he did not take his diltiazem or refill it.  2. Severe congestive cardiomyopathy with ejection fraction of 20 to 25%. His last  echocardiogram was in January of 2012.  3. History of obstructive sleep apnea.  4. History of coronary artery disease, reporting a mild heart attack in 1998.  5. Morbid obesity.  6. Hypertension.  7. Hyperlipidemia.  8. Elevated creatinine likely secondary to chronic kidney disease or stage III chronic kidney disease.  9. Significant peripheral edema and elevated B-type natriuretic peptide secondary to volume overload.   PLAN:  1. Admit the patient to the Intensive Care Unit. He was started on IV diltiazem drip in the Emergency Department, and the heart rate is improving down to the 120s. He received a couple of doses of intravenous Lasix to reduce the volume overload. I will follow up on his cardiac enzymes, troponin every 8 hours. I will repeat his basic metabolic profile in the morning. I will continue the rest of his medications, including the Coreg, Lisinopril, aspirin, pravastatin and hydrochlorothiazide. I will start his oral diltiazem while we are trying to titrate down the intravenous diltiazem, stress compliance.  2. For peptic ulcer disease prophylaxis, I will keep the patient on Protonix 40 mg p.o. daily.  3. For deep vein thrombosis prophylaxis, I will place him on Lovenox 40 mg subcutaneous once a day.   TIME SPENT:   Time needed to evaluate this patient was more than 50 minutes.   ____________________________ Carney CornersAmir M. Rudene Rearwish, MD amd:cbb D: 02/24/2011 02:10:22 ET T: 02/24/2011 08:09:57 ET JOB#: [id number]  cc:  Carney Corners. Rudene Re, MD, <Dictator> [facility] Zollie Scale MD ELECTRONICALLY SIGNED 02/24/2011 22:43

## 2014-05-09 NOTE — Discharge Summary (Signed)
PATIENT NAME:  [name], Peggy L MR#:  [id number] DATE OF BIRTH:  1965-10-12  DATE OF ADMISSION:  02/24/2011 DATE OF DISCHARGE:  02/28/2011  ADMITTING DIAGNOSIS: Atrial fibrillation with rapid ventricular response.  DISCHARGE DIAGNOSES:  1. Systemic antiinflammatory response reaction and severe hypotension. 2. Atrial fibrillation with rapid ventricular response. 3. Left lower extremity cellulitis. 4. Congestive heart failure, left heart, acute on chronic systolic. 5. Cardiomyopathy with ejection fraction of 20 to 25%. 6. Renal insufficiency.  7. Obstructive sleep apnea, not on CPAP at home.  8. Medical noncompliance.  9. Obesity.  10. Congestive heart failure, right heart, with lower extremity swelling due to non-treated obstructive sleep apnea.    DISCHARGE CONDITION: Guarded.   DISCHARGE MEDICATIONS: The patient is to resume his outpatient medications which include the following. 1. Protonix 40 mg p.o. daily. 2. Pravachol 20 mg p.o. at bedtime.  3. Aspirin 325 mg p.o. daily.  ADDITIONAL MEDICATIONS:  1. Diltiazem 300 mg p.o. daily. 2. Coreg 12.5 mg p.o. twice daily.  3. Magnesium oxide 400 mg p.o. twice daily. 4. Spironolactone 25 mg p.o. daily. 5. Lasix 40 mg p.o. daily. 6. Percocet 5/325 mg one tablet every six hours as needed. 7. Keflex 500 mg p.o. three times daily for 10 more days.   NOTE: The patient is not to take lisinopril or HCTZ until recommended by PCP or cardiologist.   HOME OXYGEN: None.   DIET: 2 grams salt, low fat, low cholesterol.   ACTIVITY LIMITATIONS: As tolerated.   DISCHARGE FOLLOWUP: Followup with the [facility] on 02/28/2011. He is also to follow up with Dr. Gwen PoundsKowalski in two days after discharge.  CONSULTANTS:  1. Care Management.  2. Arnoldo HookerBruce Kowalski, MD. 3. Harold HedgeKenneth Fath, MD.  RADIOLOGIC STUDIES: Chest, portable single view, on 02/23/2011: Findings consistent with CHF with mild pulmonary interstitial edema. Follow-up PA and lateral  would be of value, according to the radiologist.   Repeated chest x-ray, portable, single view, on 02/25/2011: No definite evidence of pneumonia. There is enlargement of cardiac silhouette without overt evidence of congestive heart failure.  Lung V/Q on 02/26/2011: Triple matched deficit within the left upper lobe with differential considerations of atelectasis versus infiltrate. An area of subsegmental pulmonary arterial embolic disease cannot be completely excluded. Otherwise, low probability ventilation/perfusion evaluation for pulmonary arterial embolic disease, according to the radiologist.  Bilateral lower extremity Doppler's on 02/24/2011: No evidence of thrombus within the right or left femoral or popliteal veins, according to the radiologist.   HISTORY OF PRESENT ILLNESS: The patient is a 49 year old Caucasian male with past medical history significant for history of atrial fibrillation, obstructive sleep apnea, and noncompliant who presented to the hospital with complaints of palpitations as well as feeling a gas bubble in his stomach, as well as lower extremity swelling. Please refer to Dr. Riley Nearingarwish's admission note on 02/24/2011.   On arrival to the emergency room, the patient was noted to be in atrial fibrillation with rapid ventricular response with heart rate of 167 per minute. He was started on IV diltiazem drip and was admitted to the Intensive Care Unit for further management of his medical problems. His initial vital signs was a pulse, as mentioned above, of 167 per minute, temperature was 98.8, and oxygen saturation was 99% on oxygen. Blood pressure was 122/88. Physical exam revealed a morbidly obese gentleman with significant lower extremity swelling as well as big abdomen, swollen abdomen. The patient's EKG showed atrial fibrillation, as mentioned above, with RVR, intraventricular premature complexes versus  aberrant conduction beats, also nonspecific T wave abnormalities and left axis  deviation. Poor progression in R waves in anterior chest leads were noted, as well as incomplete right bundle branch block.   LABS/STUDIES: On 01/23/2011 BNP was 3780, glucose 140,  and BUN and creatinine were 20 and 1.67, otherwise BMP was unremarkable. The patient's liver enzymes were unremarkable. The patient's cardiac enzymes, first set, as well as subsequent two more sets, were within limits. TSH was normal at 2.28. CBC showed a white blood cell count of 9.6, hemoglobin 15.1, and platelets 209.   EKG showed atrial fibrillation as mentioned above, RVR, premature ventricular, RVR, and aberrantly conducted complexes, left axis deviation, and incomplete right bundle branch block.   The patient's chest x-ray revealed congestive heart failure and mild pulmonary interstitial edema.   HOSPITAL COURSE: The patient was admitted to the hospital. As mentioned above, he was started on Cardizem IV drip initially for atrial fibrillation/ RVR control.  He was also started on oral medications. He required numerous doses of medications to control his heart rate after which the patient's blood pressure plummeted down and was difficult to recover requiring some IV fluid administration. It was unclear why the patient developed significant profound hypotension, especially when his heart rate was better controlled. However, it appeared that in fact he probably was septic or he had systemic inflammatory response reaction which was early not diagnosed however later on flourished. The patient developed redness and swelling as well as significantly increased heat and increased warmth of his left lower extremity cellulitic changes involving 2/3 of his calf. When the patient was noted to have infection initially, because symptoms were minimal, he was started on oral antibiotic, Keflex. However, his redness and swelling as well as pain progressed significantly and very quickly and he required IV antibiotic. He was started on  Rocephin IV and did progressively better. By the day of discharge, the patient's blood pressure improved as well as his pain, swelling, and redness with this IV antibiotic.   On the day of discharge, 02/28/2011, it was felt that the patient is stable to be changed from IV antibiotics to oral Keflex again. The patient is to follow up with his primary care physician and make decisions about antibiotics as necessary. Of note, the patient did not have blood cultures taken prior to starting him on an antibiotic unfortunately and we were not sure in fact if he had sepsis from his left lower extremity cellulitis or not. However, his presentation was very concerning for systemic inflammatory response reaction. Now with his infection better controlled, his atrial fibrillation also was better controlled. Cardiology was consulted for atrial fibrillation management and they recommended starting him on long-acting Cardizem as well as Coreg, which was reinitiated. The patient's diltiazem was increased to 300 mg p.o. daily dose as well as his Coreg was increased to 12.5 mg p.o. twice daily dose. With this therapy, the patient's heart rate as well as blood pressure were better controlled. Initially, when the patient's blood pressure was too low, all his other blood pressure medications including ACE inhibitors were discontinued.   On the day of discharge, 02/28/2011, his vitals are stable, his temperature is 98.7, pulse is ranging from 94 to 108, respiratory rate 23, blood pressure 121/69, and saturation 96 to 97% on room air at rest.  In regards to his congestive heart failure, acute on chronic, systolic, as mentioned above, on arrival to the hospital, the patient was atrial fibrillation/rapid ventricular response and it was  felt that the patient had mild congestive heart failure, left heart, acute on chronic systolic due to atrial fibrillation/rapid ventricular response. However, even with IV fluid administration, he did  not go into florid pulmonary edema and quickly responded to medications and later on was able to tolerate Lasix and other diuretics to control his lower extremity swelling. The patient has a history of an ejection fraction of 20 to 25%. On this admission, he did not undergo echocardiogram. He is to follow-up with his primary care physician as well as cardiologist and make decisions about checking an echocardiogram if needed. His most recent echocardiogram was performed on 02/10/2011. At that time, he was noted to have severely reduced left ventricular function with an estimated ejection fraction of 20 to 25%, and global hypokinesis as well as mild MR and TR.   The patient was noted to have mild renal insufficiency on admission to the hospital, however, that was felt to be related to poor perfusion due to atrial fibrillation/rapid ventricular response. With conservative therapy, the patient's kidney function normalized. On 02/28/2011, the patient's BUN and creatinine were 17 and 0.96.  The patient had significant lower extremity swelling and he was explained that his lower extremity swelling was the cause of his right-sided heart failure due to obstructive sleep apnea, noncompliance, and mismanagement. It appears that the patient had a CPAP at home; however, he was not using the CPAP. He was encouraged to find it, in the garage where he left it, and contact the company which provided him with the CPAP if he needs to replace it and start using it again. He is to continue diuretics for his lower extremity swelling.   On the day of discharge, the patient felt satisfactory, did not complain of any significant discomfort, he felt that his lower extremity swelling on the left side is improving, and the redness is also improving, as well as his pain. The patient was recommended to continue to hold lisinopril as well as HCTZ for now until he is seen by his primary care physician or cardiologist just because he just  recently had significant worrisome hypertension. He is to continue antibiotic therapy for 10 days to complete the course, as well as pain medications if needed for pain control. He is being discharged in stable condition with the above-mentioned medications and follow-up.   TIME SPENT: 40 minutes.  ____________________________ Katharina Caper, MD rv:slb D: 02/28/2011 21:31:18 ET T: 03/01/2011 11:54:56 ET JOB#: [id number]  cc: Katharina Caper, MD, <Dictator> [facility] Lamar Blinks, MD Rainelle Sulewski MD ELECTRONICALLY SIGNED 03/05/2011 12:00

## 2014-05-09 NOTE — Consult Note (Signed)
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  He has morbid obesity, sleep apnea.  He has been treated with Cardizem and carvedilol for his atrial fibrillation.  He is also on lisinopril for afterload reduction.  He was admitted in January of 2012 with atrial fibrillation with rapid ventricular response.  At that time he had been noncompliant with his medications.  On this occasion he presented to the emergency room with complaints of rapid heart rate.  He had been off of his medications for approximately one week per his report.  He was noted to be in atrial fibrillation with rapid ventricular response.  He has massive lower extremity edema.  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  His atrial fibrillation rate has improved dramatically since being placed back on his medications including carvedilol and Cardizem.  He has ruled out for myocardial infarction.  He feels back to his baseline.   Physical Exam:   GEN obese    HEENT PERRL    NECK supple    RESP clear BS    CARD Irregular rate and rhythm  Tachycardic  Murmur    Murmur Systolic    Systolic Murmur axilla    ABD denies tenderness  distended  no Abdominal Bruits    LYMPH negative neck    EXTR negative cyanosis/clubbing, positive edema, 4+ edema from his ankles to his thighs    SKIN positive rashes, bilateral erythema on his calves    NEURO cranial nerves intact, motor/sensory function intact    PSYCH A+O to time, place, person   Review of Systems:   Subjective/Chief Complaint rapid heart rate with shortness of breath    General: No Complaints    Skin: Color changes    ENT: No Complaints    Eyes: No Complaints    Neck:  No Complaints    Respiratory: Short of breath    Cardiovascular: Palpitations  Dyspnea  Edema    Gastrointestinal: No Complaints    Genitourinary: No Complaints    Vascular: No Complaints    Musculoskeletal: No Complaints    Neurologic: No Complaints    Hematologic: No Complaints    Endocrine: No Complaints    Psychiatric: No Complaints    Review of Systems: All other systems were reviewed and found to be negative    Medications/Allergies Reviewed Medications/Allergies reviewed     Hypertension:    Obesity:    GERD - Esophageal Reflux:    Atrial Fibrillation:    Sleep Apnea:    Myocardial Infarct:   Home Medications: Medication Instructions Status  lisinopril 5 mg oral tablet 1  orally once a day  Active  Protonix 40 mg oral enteric coated tablet 1  orally once a day  Active  diltiazem 240 mg/24 hours oral capsule, extended release 1  orally once a day  Active  Pravachol 20 mg tablet 1 tab(s) orally once a day (at bedtime) x 30 days  Active  aspirin 325 mg oral tablet 1 tab(s) orally once a day Active  hydrochlorothiazide 12.5 mg oral tablet 1 tab(s) orally once a day Active  Coreg CR 10 mg oral capsule, extended release 1 cap(s) orally once a day (in the morning) Active  Bee Stings: Other    Impression 49 year old male with history of atrial fibrillation, hypertension, hyperlipidemia as well as a 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  He has been on Cardizem and carvedilol as well as lisinopril as an outpatient.  He has not been felt to be a candidate for chronic anticoagulation therapy per his primary cardiologist . Dr. Gwen Pounds.  This is due largely to the fact that he travels a lot and has difficulty with compliance with INR checks.  He was admitted after being noted to be in rapid atrial fibrillation.  He does admit to being noncompliant with his medications for at least one week. his heart rate has improved with resumption of  his medications including Cardizem and carvedilol.  He does have severe peripheral edema however extensive noninvasive workup has revealed no evidence of deep vein thromboses and his ventilation perfusion lung scan is low probability for pulmonary embolus.    Plan 1.  Restart his Cardizem CD 420 mg daily as well as carvedilol 6.25 mg twice daily 2.  Stressed importance of compliance with medications as his most recent admissions have all been secondary to noncompliance 3.  Weight loss 4.  Compliance with CPAP 5.  Will add spiranolactone 25 mg daily to his regimen 6.  Low-sodium diet 7.  Keep feet elevated when not ambulating 8.  Consider compression stockings 9.should the patient's heart rate remained well controlled, would consider discharge with outpatient followup per Dr. Gwen Pounds as well as [facility].   Electronic Signatures: Dalia Heading (MD)  (Signed 11-Feb-13 15:15)  Authored: General Aspect/Present Illness, History and Physical Exam, Review of System, Past Medical History, Home Medications, Allergies, Impression/Plan   Last Updated: 11-Feb-13 15:15 by Dalia Heading (MD)

## 2014-05-16 NOTE — Consult Note (Signed)
PATIENT NAME:  Gerald Roth, Gerald Roth MR#:  [id number] DATE OF BIRTH:  August 24, 1965  DATE OF CONSULTATION:  03/28/2014  REFERRING PHYSICIAN:  Sona A. Allena Katz, MD  CONSULTING PHYSICIAN:  Lamar Blinks, MD  REASON FOR CONSULTATION: Unstable angina with elevated troponin with cardiomyopathy, chronic atrial fibrillation, and sleep apnea.   CHIEF COMPLAINT: "I had chest pain."  HISTORY OF PRESENT ILLNESS: This is a middle-aged male, noncompliant with his multiple medical problems for multiple reasons, and the patient has not been able to use his medications due to some issues with clots. The patient has had known coronary artery disease status post a previous myocardial infarction, cardiomyopathy with ejection fraction of 25% with chronic systolic dysfunction heart failure. The patient also has chronic nonvalvular atrial fibrillation likely secondary to above, as well as from chronic sleep apnea, not treated with CPAP machine due to inability to tolerate this machine. The patient has had new onset of significant issues with shortness of breath, weakness and fatigue, and lower extremity edema worsening from before, with some chest pressure. This chest pressure feels like a bubble in his chest and it is slowly pressure with some burning. The patient has had this burning off and on, waxing and waning for the last 2 days, and seen in the Emergency Room with an EKG showing atrial fibrillation with nonspecific ST changes with a controlled heart rate of 93 beats per minute. The patient has an elevated troponin, possibly consistent with demand ischemia versus a subendocardial myocardial infarction. There are no other significant symptoms at this time.   REVIEW OF SYSTEMS: The remainder of review of systems negative for vision change, ringing in the ears, hearing loss, cough, congestion, nausea, vomiting, diarrhea, bloody stools, stomach pain, extremity pain, leg weakness, cramping of the buttocks, known blood clots,  headaches, blackouts, dizzy spells, nosebleeds, congestion, trouble swallowing, frequent urination, urination at night, muscle weakness, numbness, anxiety, depression, skin lesions, skin rashes.   PAST MEDICAL HISTORY:  1.  Chronic nonvalvular atrial fibrillation, new.  2.  Chronic systolic dysfunction, congestive heart failure.  3.  Coronary artery disease with angina.  4.  Sleep apnea.  5.  Significant lower extremity edema.   FAMILY HISTORY: Multiple family history with early onset of cardiovascular disease and hypertension.   SOCIAL HISTORY: Currently denies alcohol or tobacco use.   ALLERGIES: As listed.   MEDICATIONS: As listed.   PHYSICAL EXAMINATION:  VITAL SIGNS: Blood pressure is 110/68 bilaterally. Heart rate 90 upright and reclining and irregular.  GENERAL: He is a well-appearing large male in no acute distress.  HEAD, EYES, EARS, NOSE, THROAT: No icterus, thyromegaly, ulcers, hemorrhage, or xanthelasma.  CARDIOVASCULAR: Irregularly irregular with distant heart sounds. No apparent PMI. Carotid upstroke normal without bruit. Jugular venous pressures not seen.  LUNGS: Have bibasilar crackles with diffuse expiratory wheezes.  ABDOMEN: Soft, nontender. Cannot assess hepatosplenomegaly or masses due to increased abdominal girth.  EXTREMITIES: 2+ radial, no femoral, dorsal pedal pulses due to severe 3+ lower extremity edema with ulcers and weeping.  NEUROLOGIC: He is oriented to time, place, and person, with normal mood and affect.   ASSESSMENT: A middle-aged male with known chronic systolic dysfunction, congestive heart failure, chronic nonvalvular atrial fibrillation with controlled ventricular rate, sleep apnea, coronary artery disease with unstable angina versus non-ST-elevation myocardial infarction.   RECOMMENDATIONS:  1.  Continue heart rate control with diltiazem, beta blocker combination for goal heart rate between 60 and 90 beats per minute.  2.  Continue serial ECG and  enzymes to assess for possible myocardial infarction.  3.  Heparin if able for further risk reduction of stroke with atrial fibrillation and myocardial infarction.  4.  Patient to receive Lasix as necessary for lower extremity edema and continued weight gain.  5.  Nitrates sublingually and/or topically for further risk reduction and treatment of cardiovascular disease and coronary atherosclerosis.  6.  Echocardiogram for LV systolic dysfunction contributing to above. 7.  Further adjustments of medications and treatment options after above.   ____________________________ Lamar BlinksBruce J. Dierdre Mccalip, MD bjk:ST D: 03/28/2014 19:32:58 ET T: 03/28/2014 23:09:04 ET JOB#: [id number]  cc: Lamar BlinksBruce J. Romey Mathieson, MD, <Dictator> Lamar BlinksBRUCE J Bishoy Cupp MD ELECTRONICALLY SIGNED 04/01/2014 8:09

## 2014-05-16 NOTE — Consult Note (Signed)
PATIENT NAME:  Gerald Roth, Gerald Roth MR#:  [id number] DATE OF BIRTH:  [birth year]  DATE OF CONSULTATION:  04/22/2014  REFERRING PHYSICIAN:  Dr. Allena KatzPatel CONSULTING PHYSICIAN:  Lamar BlinksBruce J. Charlie Seda, MD  REASON FOR CONSULTATION: Heart failure with atrial fibrillation.   CHIEF COMPLAINT: "I'm short of breath."   HISTORY OF PRESENT ILLNESS: This is a 49 year old male with known chronic nonvalvular atrial fibrillation with variable heart rate due to compliance. The patient has not had appropriate medication management due to inability to get the medications. He also has not had anticoagulation as well due to the fact that the patient is noncompliant. The patient has had multiple episodes of chest and abdominal pressure with and without physical activity with increased lower extremity edema and abdominal edema for which it appears to be congestive heart failure, systolic in nature. He has had an ejection fraction of 25% with global LV systolic dysfunction in the past. He also has some moderate mitral and tricuspid valvular disease. The patient does have known sleep apnea for which he is noncompliant with his CPAP machine. He is having some pulmonary edema, lower extremity edema, consistent with congestive heart failure with atrial fibrillation with rapid ventricular rate. His abdominal discomfort has improved since his admission and diuresis.  REVIEW OF SYSTEMS: The remainder of review of systems is difficult to discuss due to the patient's somnolence.   FAMILY HISTORY: Multiple family members with early onset of cardiovascular disease, hypertension and coronary artery disease.   SOCIAL HISTORY: Currently denies alcohol or tobacco use.   ALLERGIES: As listed.   MEDICATIONS: As listed.   PHYSICAL EXAMINATION: VITAL SIGNS: Blood pressure is 120/68 bilaterally, heart rate is 110 upright and reclining and irregular.  GENERAL: He is a well-appearing large male in no acute distress.  HEENT: No icterus,  thyromegaly, ulcers, hemorrhage, or xanthelasma.  CARDIOVASCULAR: Irregularly irregular. Normal S1, S2. PMI is diffuse. Carotid upstroke and jugular venous pressure not seen.  LUNGS: Bibasilar crackles with decreased breath sounds.  ABDOMEN: Soft, nontender. Cannot assess hepatosplenomegaly or masses due to increased abdominal girth.  EXTREMITIES: 2+ radial, no dorsal pedal pulses or femoral pulses, 2+ bilateral pitting lower extremity edema with brawny changes.  NEUROLOGIC: He is oriented to time, place, and person with normal mood and affect.   ASSESSMENT: A 49 year old male with nonvalvular chronic atrial fibrillation with rapid ventricular rate due to systolic dysfunction and congestive heart failure, acute on chronic, with known coronary artery disease with previous myocardial infarction with sleep apnea, noncompliance at this time, pulmonary edema.   RECOMMENDATIONS: 1.  Intravenous Lasix for lower extremity edema, pulmonary edema and acute on chronic systolic dysfunction congestive heart failure.  2.  Reinstatement of metoprolol for heart rate control of atrial fibrillation and help with cardiomyopathy.  3.  No anticoagulation due to severe noncompliance of the patient and concerns of bleeding complications.  4.  Reinstatement of CPAP machine for sleep apnea, cardiomyopathy.  5.  Begin ambulation and further treatment options. No further intervention due to no evidence of myocardial infarction at this time. ____________________________ Lamar BlinksBruce J. Colbi Staubs, MD bjk:sb D: 04/22/2014 13:23:34 ET T: 04/22/2014 13:55:35 ET JOB#: [id number]  cc: Lamar BlinksBruce J. Devansh Riese, MD, <Dictator> Lamar BlinksBRUCE J Mihailo Sage MD ELECTRONICALLY SIGNED 05/05/2014 8:57

## 2014-05-16 NOTE — Consult Note (Signed)
PATIENT NAME:  Gerald Roth, Gerald Roth MR#:  [id number] DATE OF BIRTH:  [birth year]  DATE OF CONSULTATION:  02/04/2014  REFERRING PHYSICIAN:  Dr. Cletis Athensavid K. Hower  CONSULTING PHYSICIAN:  Marcina MillardAlexander Orlen Leedy, MD  CARDIOLOGIST:  Dr. Gwen PoundsKowalski.   CHIEF COMPLAINT: Have rapid heart rate.   HISTORY OF PRESENT ILLNESS: The patient is a 49 year old gentleman with known history of cardiomyopathy, chronic systolic congestive heart failure and atrial fibrillation who [facility] Room with tachycardia. The patient apparently ran out of his medications including warfarin, and Cardizem. The patient presented to [facility] Emergency Room with shortness of breath and tachycardia with EKG revealing atrial fibrillation with a rapid ventricular rate. The patient was treated with Cardizem bolus and drip and admitted to the CCU. Admission labs were notable for borderline elevated troponin of 0.65, BUN and creatinine were 20 and 1.97, respectively. The patient denies chest pain. He does have chronic exertional dyspnea.   PAST MEDICAL HISTORY:  1.  Dilated cardiomyopathy with reported LVEF 25%.  2.  Atrial fibrillation.  3.  Obstructive sleep apnea on CPAP.  4.  Morbid obesity.  5.  Hyperlipidemia.  6.  Hypertension.   MEDICATIONS ON ADMISSION: Digoxin 0.125 mg daily, diltiazem 300 mg daily, metoprolol 100 mg b.i.d., Protonix 40 mg b.i.d., and warfarin.   SOCIAL HISTORY: The patient denies tobacco abuse.   FAMILY HISTORY: No immediate family history of coronary artery disease or myocardial infarction.   REVIEW OF SYSTEMS:   CONSTITUTIONAL: No fever or chills.  EYES: No blurry vision.  EARS: No hearing loss.  RESPIRATORY: Shortness of breath.  CARDIOVASCULAR: The patient denies chest pain.  GASTROINTESTINAL: No nausea, vomiting, or diarrhea.  GENITOURINARY: No dysuria or hematuria.  ENDOCRINE: No polyuria or polydipsia.  INTEGUMENTARY: No rash.  MUSCULOSKELETAL: No arthralgias or myalgias.   NEUROLOGICAL: No focal muscle weakness or numbness.  PSYCHOLOGICAL: No depression or anxiety.   PHYSICAL EXAMINATION:   VITAL SIGNS: Blood pressure was 108/72, pulse is 110 and irregularly irregular, respirations 35, temperature 97.6, pulse oximetry 96%.  HEENT: Pupils equal, reactive to light and accommodation.  NECK: Supple without thyromegaly.  LUNGS: Revealed decreased breath sounds in both bases.  CARDIOVASCULAR: Normal JVP. Normal PMI, tachycardia. Normal S1, S2. No appreciable gallop, murmur, or rub.  ABDOMEN: Soft and nontender. Pulses were intact bilaterally.  MUSCULOSKELETAL: Normal muscle tone.  NEUROLOGIC: The patient is alert and oriented x3. Motor and sensory both grossly intact.   IMPRESSION: A 49 year old gentleman with a history of atrial fibrillation who presents with rapid ventricular rate due to medical and probable dietary noncompliance. The patient has borderline elevated troponin likely due to demand supply ischemia secondary to tachycardia. The patient is currently without chest pain.   RECOMMENDATIONS:  1.  Agree with overall current therapy.  2.  Continue Cardizem drip for now. We will convert to p.o. Cardizem when heart rate under better control.  3.  Resume digoxin and metoprolol as previously prescribed.  4.  Resume chronic anticoagulation.  5.  Defer further cardiac diagnostics at this time.     ____________________________ Marcina MillardAlexander Jauna Raczynski, MD ap:at D: 02/04/2014 16:34:40 ET T: 02/04/2014 16:57:13 ET JOB#: [id number]  cc: Marcina MillardAlexander Dorcas Melito, MD, <Dictator> Marcina MillardALEXANDER Samael Blades MD ELECTRONICALLY SIGNED 03/09/2014 14:34

## 2014-05-16 NOTE — H&P (Signed)
PATIENT NAME:  Gerald Roth, Gerald Roth MR#:  [id number] DATE OF BIRTH:  [birth year]  DATE OF ADMISSION:  03/28/2014  CHIEF COMPLAINT: Chest pain today.   HISTORY OF PRESENT ILLNESS: Gerald Roth is a 49 year old, morbidly obese, 430 pound gentleman who has history of coronary artery disease, status post angioplasty in the past, suspected sleep apnea, hypertension, chronic atrial fibrillation, on Coumadin, who has been noncompliant with his medications due to financial constraints, comes to the Emergency Room complaining of chest pain, which happened today. The patient reports saying, "I could not get that bubble out." During my evaluation, his pain was 3/10. He was feeling back to baseline. First cardiac enzyme was 0.07, repeat was 0.11. The patient did have some burning chest pain, hence, is going to be started on IV heparin drip. Cardiology, Dr. Gwen Pounds, will see patient in consultation. He has also been given some nitroglycerin paste.   The patient reports he has only been able to afford to get Lasix and Cardizem of all his medications since January. He has not followed up with an physician and is trying to work on his job so he can afford his medications.   PAST MEDICAL HISTORY: 1.  CAD, status post angioplasty in the past.  2.  Ischemic cardiomyopathy with EF of 25%.  3.  Hyperlipidemia.  4.  Hypertension.  5.  GERD.  6.  Obstructive sleep apnea, noncompliant with CPAP.  7.  Chronic atrial fibrillation. He is supposed to be on warfarin; however, has not taken warfarin for a good long time.   SOCIAL HISTORY: Denies tobacco abuse. Denies any other drug use.   FAMILY HISTORY: Positive for diabetes.   ALLERGIES: BEE STINGS.  HOME MEDICATIONS:  Are supposed to be:  1.  Warfarin 10 mg every 5:00 p.m. which he has not taken.  2.  Protonix 40 mg b.i.d., which he has not taken.  3.  Metoprolol 100 mg b.i.d.  4.  Lisinopril 5 mg daily.  5.  Lasix 40 mg b.i.d.  6.  Diltiazem  extended-release 300 mg p.o. daily.  7.  Digoxin 250 mcg p.o. daily.   REVIEW OF SYSTEMS:  CONSTITUTIONAL: No fever, fatigue, weakness.  EYES: No blurred or double vision, glaucoma, or cataract.  EARS, NOSE, THROAT: No tinnitus, ear pain, hearing loss.  RESPIRATORY:  No cough, wheeze, hemoptysis.  CARDIOVASCULAR: Positive for chest pain, dyspnea on exertion, and leg edema.  GASTROINTESTINAL: No nausea, vomiting, diarrhea, abdominal pain. No GERD.  GENITOURINARY: No dysuria, hematuria, or frequency.  ENDOCRINE: No polyuria, nocturia, or thyroid problems.  HEMATOLOGY: No anemia or easy bruising or bleeding.  SKIN: No acne, rash.  MUSCULOSKELETAL: Positive for arthritis and swelling of both lower extremities. No gout.  NEUROLOGIC: No CVA, TIA.   PHYSICAL EXAMINATION: GENERAL: The patient is morbidly obese, 430 pounds, not in acute distress.  VITAL SIGNS:  He is afebrile. Pulse is 86, blood pressure is 126/72, saturations are 96% on room air.  HEENT: Atraumatic, normocephalic. PERRLA, EOM intact. Oral mucosa is moist.  NECK: Supple. No JVD. No carotid bruit.  RESPIRATORY: Clear to auscultation bilaterally. Decreased breath sounds, no respiratory distress, or labored breathing.  CARDIOVASCULAR: Both the heart sounds are normal. Rate and rhythm is irregularly irregular. No murmur heard. PMI not lateralized.  ABDOMEN: Obese, soft, nontender. No organomegaly, cannot appreciate bowel sounds due to obesity.  EXTREMITIES: The patient has severe bilateral 4+ pitting edema with chronic venous stasis changes. His skin is very dry. No cellulitis seen. Cannot appreciate pedal  pulses or femoral pulses due to his body habitus.  NEUROLOGIC:  Grossly intact cranial nerves 2-12. No motor or sensory deficit.  PSYCHIATRIC: The patient is awake, alert, oriented x 3.   DIAGNOSTIC DATA: EKG shows in atrial fibrillation with PVC, left axis deviation and Q waves which appears old in anteroseptal lead . Troponin is  0.11, first troponin was 0.07. Chest x-ray shows cardiomegaly with mild congestive heart failure changes. CBC within normal limits. Creatinine is 1.43. Sodium 126, potassium was 3.4. B-type natriuretic peptide is 159.   ASSESSMENT: A 49 year old, Gerald Roth, with history of coronary artery disease status post angioplasty in the past along with history of chronic atrial fibrillation and ischemic cardiomyopathy with ejection fraction of 25%, comes in with:  1.  Chest pain with positive troponin. The patient has history of coronary artery disease with   [facility]. We will admit him to telemetry floor, start him on intravenous heparin drip, continue aspirin, beta blockers, Cardizem and nitroglycerin paste. Cardiology consultation has been placed with Dr. Gwen PoundsKowalski who will see the patient tonight.  2.  Elevated troponin, likely due to either demand ischemia versus due to acute coronary syndrome. Workup for cardiology evaluation.  3.   Acute on chronic kidney disease stage 2. Creatinine is 1.4. Will monitor ins and outs, avoid nephrotoxins.  4.  Hypertension. Resume back Cardizem and metoprolol.  5.  Chronic systolic congestive heart failure. Does not have excess volume.  Overall, however, he does have severe chronic bilateral lower extremity edema which is chronic. No other signs of exacerbation.  6.  Gastroesophageal reflux disease. Continue proton pump inhibitor. 7.  Deep vein thrombosis prophylaxis. The patient will be on heparin drip.   Above was discussed with the patient and case was also discussed with Dr. Gwen PoundsKowalski.   TIME SPENT: 50 minutes.  CODE STATUS: The patient is a full code.   ____________________________ Wylie HailSona A. Allena KatzPatel, MD sap:LT D: 03/28/2014 19:10:09 ET T: 03/28/2014 20:37:26 ET JOB#: [id number]  cc: Patsey Pitstick A. Allena KatzPatel, MD, <Dictator> Willow OraSONA A Korri Ask MD ELECTRONICALLY SIGNED 04/19/2014 12:18

## 2014-05-16 NOTE — H&P (Signed)
PATIENT NAME:  Gerald Roth, Gerald Roth MR#:  [id number] DATE OF BIRTH:  01/08/1966  DATE OF ADMISSION:  04/22/2014  CARDIOLOGIST:  Lamar Blinks, M.D.   CHIEF COMPLAINT: Palpitations and epigastric fullness.   HISTORY OF PRESENTING ILLNESS: A 49 year old, morbidly obese, Caucasian male patient with history of sleep apnea, hypertension, chronic atrial fibrillation, and noncompliant with his medications.  Presents to the Emergency Room complaining of epigastric fullness and palpitations. The patient mentions that he gets this epigastric fullness whenever his heart speeds up due to the atrial fibrillation. The patient was recently in the hospital with chest pain and was ruled out for acute coronary syndrome and was discharged home. During recent admission, his Coumadin was stopped.  The patient mentioned that over the past 4 days he has not been taking his medications. He has had recurrent admissions due to skipping medication due to financial reasons.   In the Emergency Room, the patient was given 3 boluses of Cardizem 10 mg IV, in spite of which his heart rate is still up in the 150s and the hospitalist team was consulted.   PAST MEDICAL HISTORY:  1.  Coronary artery disease status post angioplasty in the past.  2.  Ischemic cardiomyopathy with ejection fraction 25%.  3.  Hyperlipidemia.  4.  Hypertension.  5.  GERD. 6.  Obstructive sleep apnea, noncompliant with CPAP.  7.  Chronic atrial fibrillation of Coumadin now.  8.  Noncompliance with medications.   SOCIAL HISTORY: Does not smoke. No alcohol. No illicit drugs. Works as a Scientist, physiological.   FAMILY HISTORY: Diabetes.   ALLERGIES: BEE STINGS.  HOME MEDICATIONS:  1.  Protonix 40 mg 2 times a day.  2.  Metoprolol 100 mg 2 times a day.  3.  Lisinopril 5 mg daily. 4.  Lasix 40 mg 2 times a day.  5.  Cardizem extended release 300 mg daily.  6.  Digoxin 250 mcg oral daily.   REVIEW OF SYSTEMS:  CONSTITUTIONAL: Please see history of  presenting illness.  The rest of the systems reviewed and negative.   PHYSICAL EXAMINATION:  VITAL SIGNS: Shows temperature 97.8, blood pressure 124/96, pulse of 140 irregular, saturating 92% on room air.  GENERAL: Morbidly obese, Caucasian, male patient lying in bed.  PSYCHIATRIC: Alert and oriented x 3, pleasant.  HEENT: Atraumatic and normocephalic.  Oral mucosa moist and pink. External ears normal. No pallor. No icterus.  NECK: Supple. No thyromegaly. No palpable lymph nodes. Trachea midline. No carotid bruit or JVD.  CARDIOVASCULAR: S1, S2, tachycardic, irregular. lower extremity edema positive.  GASTROINTESTINAL: Soft abdomen, nontender, distended. SKIN:  Has chronic ulcerations, erythema in the bilateral lower extremities.  NEUROLOGICAL: Motor strength 5/5 in upper extremities.   LABORATORY STUDIES:  1.  With glucose of 130, BUN 28, creatinine 1.83, sodium 137, potassium 3.9. AST, ALT, alkaline phosphatase, bilirubin normal. Troponin less than 0.03.  2.  WBC 9.8, hemoglobin 14.2, platelets of 317.  3.  Blood cultures from 04/16/2014, 1 out of 2 coagulase-negative Staphylococcus thought to be contaminant.  4.  Urinalysis shows no bacteria.  5.  EKG shows atrial fibrillation.  6.  Chest x-ray, portable, shows improved vascular congestion.   ASSESSMENT AND PLAN:  1.  Atrial fibrillation with rapid ventricular rate due to noncompliance with his medications. At this point, the patient will be placed on a Cardizem drip due to rapid ventricular rate in spite of multiple Cardizem boluses. Will restart his metoprolol from home.  Consult cardiology. The patient is off  anticoagulation at this point per Dr. Philemon KingdomKowalski's recommendation from recent admission.  Start him on aspirin. The patient will be admitted to critical care unit.  Critically ill with high risk for cardiac arrest and death. Discussed with patient regarding the plan. Also counseled him to be compliant with medications.  2.  Chronic  systolic congestive heart failure. Continue home dose of Lasix.  3.  Chronic kidney disease stage III, stable.  4.  Lower extremity cellulitis, mild.  The patient was given doxycycline recently in the Emergency Room which he has not taken. Will put him on oral doxycycline.  5.  Hypertension. Continue medications.  6.  Deep vein thrombosis prophylaxis with Lovenox.   CODE STATUS: Full code.   Critical care time spent on this patient was 45 minutes.   ____________________________ Molinda BailiffSrikar R. Tachina Spoonemore, MD srs:sp D: 04/22/2014 13:18:37 ET T: 04/22/2014 13:59:23 ET JOB#: [id number]  cc: Wardell HeathSrikar R. Edvardo Honse, MD, <Dictator> Orie FishermanSRIKAR R Stevenson Windmiller MD ELECTRONICALLY SIGNED 05/10/2014 11:17

## 2014-05-16 NOTE — Discharge Summary (Signed)
PATIENT NAME:  Gerald Roth, Gerald Roth MR#:  [id number] DATE OF BIRTH:  01-05-1966  DATE OF ADMISSION:  03/28/2014 DATE OF DISCHARGE:  03/29/2014  DISCHARGE DIAGNOSES:  1.  Unstable angina, rule out acute myocardial infarction.  2.  Chronic systolic congestive heart failure with cardiomyopathy.  3.  Chronic kidney disease.  4.  Chronic bilateral lower extremity edema.   DISCHARGE DIAGNOSES: Noncompliance.  CONSULTATIONS: [facility], Lamar Blinks, MD.   PROCEDURES: None.  CODE STATUS: Full code.  MEDICATIONS: At the time of discharge: Diltiazem 300 mg 1 capsule p.o. once daily, extended release; lisinopril 5 mg p.o. once daily; digoxin 0.25 mg 1 tablet p.o. once a day; metoprolol tartrate 100 mg p.o. b.i.d.; Lasix 40 mg p.o. 2 times a day; Protonix 40 mg 1 tablet p.o. 2 times a day; nitroglycerin 0.4 mg sublingually every 5 minutes as needed for chest pain x 3, dispense #10; aspirin 81 mg p.o. once daily; Tylenol 325 mg 2 tablets p.o. every 4 hours as needed for mild pain or temperature greater than 100.4; potassium chloride 20 mEq p.o. once daily. Discontinued Coumadin.  DISPOSITION: Discharged home with home health with physical therapy, nurse, and nurse aide.   DIET: Low sodium, low fat, regular consistency.  FOLLOWUP: With Dr. Gwen Pounds in 1 to 2 days and obtain outpatient echocardiogram at Dr. Philemon Kingdom office. Follow up with primary care physician in 1 week.   ACTIVITY: As tolerated as recommended by physical therapy.   BRIEF HISTORY AND PHYSICAL AND HOSPITAL COURSE: The patient is a 49 year old morbidly obese male who came into the ED with a chief complaint of chest pain. The patient has chronic history of atrial fibrillation and on Coumadin. He is noncompliant with his medications due to financial constraints and came into the Emergency Room with a chief complaint of chest pain. He reports "I could not get the bubble out." His initial troponin was at 0.07. Repeat one was 0.11.  Please see history and physical for details. The patient was started on IV heparin drip and Dr. Gwen Pounds was consulted. Nitroglycerin paste  to the anterior chest wall.   Hospital course based on the problems:  1.  Unstable angina. The patient was admitted to telemetry floor. IV heparin drip was continued. The patient was also given aspirin, beta blocker, Cardizem and nitroglycerin paste. His third troponin was at 0.13. The patient is totally chest pain-free today. Here the elevated troponin trend is not significant and it was thought to be from demand ischemia. The patient was seen by [facility], Dr. Gwen Pounds. He has recommended no further cardiology intervention and recommended the patient to follow up with him as an outpatient in 2 days. Echocardiogram was also recommended to get done as an outpatient at his office. The patient is notorious for being noncompliant, as reported by Dr. Gwen Pounds. Heparin drip was discontinued and the plan is to continue aspirin and beta blocker.  2.  Chronic atrial fibrillation, rate controlled. Continue beta blocker and Cardizem. Continue baby aspirin. His Coumadin was discontinued by Dr. Gwen Pounds in view of his noncompliance. The patient was reinforced to be compliant with his rest of the medications.  3.  Acute on chronic kidney disease, stage 2. Plan is to avoid nephrotoxins. It seems like the patient's baseline creatinine itself is at 1.4. The plan is to monitor his renal function closely and avoid nephrotoxins if necessary. PCP to refer the patient to outpatient nephrology clinic. 4.  Chronic systolic congestive heart failure with cardiomyopathy. Previous ejection fraction  was 25% in August 2014. Currently, the patient is well compensated but he has reported that his bilateral lower extremity edema is worse, as he stands at his work most of the time. Dr. Gwen PoundsKowalski has recommended to continue his Lasix with potassium supplements. Outpatient echocardiogram is recommended  by Dr. Gwen PoundsKowalski in 1 to 2 days at his office.  5.  GERD. Patient ran out of his prescription Protonix recently, so the Protonix was resumed and prescription was provided. 6.  Left knee pain. The patient had trauma to the left knee. He is complaining of pain. X-ray has revealed no fractures or effusions. We have recommended Tylenol and ice packs. Home PT is arranged.  Overall, the patient's clinical situation is stable at the time of discharge. Care management is consulted to arrange home health with RN, aide, and physical therapy.  SIGNIFICANT LABORATORIES AND IMAGING STUDIES: Outpatient echocardiogram was recommended. The patient's echocardiogram was done while he was in the hospital. Left ventricular ejection fraction has revealed  moderately decreased global left ventricular systolic function, mild left ventricular hypertrophy, mildly dilated left atrium, mildly increased left ventricular posterior wall thickness. The patient's BNP is 159, BUN 15, creatinine 1.43. Sodium 136, potassium 3.4, chloride 98, CO2 of 29. Anion gap 9. GFR of 58. Calcium 8.6. CPK-MB 4.8, 4.6, 5.1. Troponins 0.07, 0.11, 0.13. WBC 7.7; hemoglobin, hematocrit, and platelets are normal. Chest x-ray, PA and lateral views: Appearance of the chest suggests mild congestive heart failure. Left knee x-ray: Mild narrowing medially. No fracture or dislocation. No radiopaque foreign body. No joint effusion.  Plan of care was discussed in detail with the patient. He verbalized understanding of the plan.  TOTAL TIME SPENT ON THE DISCHARGE: 45 minutes.   ____________________________ Gerald LabAruna Lamond Glantz, MD ag:ST D: 03/29/2014 22:29:16 ET T: 03/30/2014 02:30:33 ET JOB#: [id number]  cc: Gerald LabAruna Malikai Gut, MD, <Dictator> Lamar BlinksBruce J. Kowalski, MD Primary care physician Gerald LabARUNA Byanca Kasper MD ELECTRONICALLY SIGNED 04/13/2014 16:42

## 2014-05-16 NOTE — Discharge Summary (Signed)
PATIENT NAME:  [name], Gerald L MR#:  [id number] DATE OF BIRTH:  [birth year]  DATE OF ADMISSION:  02/04/2014 DATE OF DISCHARGE:  02/06/2014  PRESENTING COMPLAINT:  "I am out of medication."  DISCHARGE DIAGNOSES:  1.  Acute on chronic rapid atrial fibrillation.  2.  Morbidly obese.  3.  Chronic leg edema.  4.  Hypertension.   CODE STATUS: Full code.   MEDICATIONS:  1.  Protonix 40 mg b.i.d.  2.  Diltiazem 300 mg extended release p.o. daily.  3.  Warfarin 10 mg p.o. daily.  4.  Lasix 40 mg b.i.d.  5.  Lisinopril 5 mg p.o. daily.  6.  Digoxin 250 mcg p.o. daily.  7.  Metoprolol 100 mg b.i.d.    The patient to call Dr. Philemon KingdomKowalski's office on Monday January 25, to get PT/INR checked. Follow up with Dr. Gwen PoundsKowalski in one to two weeks.   PT/INR is 13.8 and 1.1. CBC within normal limits. Basic metabolic panel within normal limits, except creatinine of 1.4.  Urine drug screen negative. Troponin 0.53, 0.44.    CHEST X-RAY: No acute cardiopulmonary disease. Cardiology consultation with Dr. Darrold JunkerParaschos.   BRIEF SUMMARY OF HOSPITAL COURSE:  Gerald HandlerBenjamin Roth is a 49 year old morbidly obese gentleman with past medical history of chronic atrial fibrillation on Coumadin, chronic systolic congestive heart failure with ejection fraction of 25%, history of obstructive sleep apnea noncompliant with CPAP, and history of CAD, comes to the Emergency Room since he was not feeling well and he had ran out of his medications.   He was admitted with: 1.  Atrial fibrillation with RVR.  He was started back on his metoprolol, diltiazem and digoxin. Current heart rate is 90-110, has not taken his medications for several days. He was started on IV heparin drip since he was out of his Coumadin and his troponin was borderline elevated which is likely due to his rapid atrial fibrillation. He was seen by Dr. Darrold JunkerParaschos who recommended continued IV heparin drip for 48 hours and his p.o. Coumadin was resumed. He will follow up  with Dr. Philemon KingdomKowalski's office to get his PT/INR checked as outpatient. His heart rate at discharge was around 90.   2.  Elevated troponin without EKG changes, likely due to strain in the setting of RVR, received heparin drip for 48 hours. Cardiology did not recommend further diagnostics.  3.  Acute renal failure, improved. Electrolytes are stable. His creatinine is around 1.4.  4.  Hypertension. Blood pressure was stable.  5.  Chronic systolic congestive heart failure with chronic leg edema and venous stasis changes. He does have excess volume on lower extremity, however, no other signs of exacerbation. He was resumed back on his home medications.  6.  Deep vein thrombosis prophylaxis.  He was on heparin drip along with Coumadin. Hospital stay otherwise remained stable.  He remained a full code.   TIME SPENT: 40 minutes.    ____________________________ Wylie HailSona A. Allena KatzPatel, MD sap:at D: 02/07/2014 11:52:45 ET T: 02/07/2014 12:11:01 ET JOB#: [id number]  cc: Deyona Soza A. Allena KatzPatel, MD, <Dictator> Lamar BlinksBruce J. Kowalski, MD Willow OraSONA A Dahna Hattabaugh MD ELECTRONICALLY SIGNED 02/09/2014 14:19

## 2014-05-16 NOTE — Discharge Summary (Signed)
PATIENT NAME:  Gerald Roth, [name] MR#:  [id number] DATE OF BIRTH:  [birth year]  DATE OF ADMISSION:  04/22/2014 DATE OF DISCHARGE:  04/26/2014  DISCHARGE DIAGNOSES: 1. Atrial fibrillation with rapid ventricular rate.  2. Acute on chronic diastolic congestive heart failure.  3. Acute renal failure over chronic kidney disease stage III. 4. Hypertension.  5. Morbid obesity.  6. 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  7. Noncompliance.  8. Acute respiratory failure.  9. Sleep apnea.   DISCHARGE MEDICATIONS: 1. Protonix 40 mg 2 times a day.  2. Metoprolol tartrate 100 mg 2 times a day.  3. Lasix 80 mg once a day, take 2 tablets for the first 5 days.  4. Cardizem  240 mg extended release daily.  5. Doxycycline 100 mg oral 2 times a day.  6. Aspirin 81 mg daily.   CONSULTANTS:  Lamar BlinksBruce J. Kowalski, MD, with cardiology.   IMAGING: Chest x-ray showed pulmonary edema improving.   ADMITTING HISTORY AND PHYSICAL: Please see detailed H and P dictated previously. In brief, a 49 year old male patient with history of morbid obesity, diastolic CHF, atrial fibrillation and noncompliant brought into the hospital complaining of some epigastric fullness and was found to have atrial fibrillation with RVR.   The patient is noncompliant and had not taken any of his medications for the past 4 days prior to admission.   HOSPITAL COURSE: 1. Atrial fibrillation with rapid ventricular rate. The patient was restarted on his home medications with which his heart rate is much better controlled. He does have chronic atrial fibrillation and heart rate is less than 100 by day of discharge and his epigastric fullness has resolved.  Cardiology has followed the patient during the hospital stay.  2. Acute on chronic diastolic CHF. The patient is negative 12  liters of fluid and feels much better with his breathing by time of discharge, although he does have acute respiratory failure and saturations are less  than 88% on room air and is being set up with home oxygen.  3. Hypertension. Continue on medications.   I have counseled the patient on multiple occasions to be compliant with his medication. Also, he has received his new prescriptions from  the hospital pharmacy.   Prior to discharge, the patient's lungs sound clear. S1, S2 heard, irregular, and no edema. He does have chronic lower extremity ulceration with mild cellulitis  on doxycycline.  Has been referred to [facility].   DISCHARGE INSTRUCTIONS: Follow up in the [facility] Clinic, PCP, and Dr. Gwen PoundsKowalski in 1-2 weeks, be compliant with  medication, daily fluids less than 2 liters.   TIME SPENT ON DAY OF DISCHARGE IN DISCHARGE ACTIVITY:  Was 40 minutes.    ____________________________ Molinda BailiffSrikar R. Amadea Keagy, MD srs:tr D: 04/27/2014 16:01:38 ET T: 04/27/2014 21:22:10 ET JOB#: [id number]  cc: Wardell HeathSrikar R. Penn Grissett, MD, <Dictator> Orie FishermanSRIKAR R Cherye Gaertner MD ELECTRONICALLY SIGNED 05/10/2014 11:17

## 2014-05-16 NOTE — H&P (Signed)
PATIENT NAME:  Gerald Roth, Gerald Roth MR#:  [id number] DATE OF BIRTH:  [birth year]  DATE OF ADMISSION:  02/03/2014  REFERRING PHYSICIAN: [place] Sink. Dolores Frame, MD   PRIMARY CARE PHYSICIAN: Nonlocal. However, does follow with cardiologist, Dr. Gwen Pounds at [facility].   CHIEF COMPLAINT: "I'm out of medications."   HISTORY OF PRESENT ILLNESS: This is a 49 year old Caucasian gentleman with history of morbid obesity; coronary artery disease with ischemic cardiomyopathy, EF of 25%; chronic atrial fibrillation, presenting with a chief complaint of "out of medication." Says his last Cardizem dose over 1 week ago, warfarin about 2-3 weeks ago. Denies any chest pain, palpitations; however, does complain of dyspnea on exertion as well as lower extremity edema, surprisingly, slightly improved from baseline. Presenting to the hospital, once again for difficulty receiving his medications. Upon arrival, noted be in atrial fibrillation with heart rate of 185. Surprising, once again, no complaints at that time. He received doses of IV Cardizem as well as p.o. without any response and subsequently placed on Cardizem drip in the Emergency Department.   REVIEW OF SYSTEMS:  CONSTITUTIONAL: Denies fevers, chills, fatigue, weakness.  EYES: Denies blurred vision, double vision, eye pain. EAR, NOSE, THROAT: Denies tinnitus, ear pain, hearing loss.  RESPIRATORY: Denies cough, wheeze, shortness of breath.  CARDIOVASCULAR: Denies chest pain, palpitations. Positive for lower extremity edema.  GASTROINTESTINAL: Denies nausea, vomiting, and abdominal pain.  GENITOURINARY: Denies dysuria or hematuria. ENDOCRINE: Denies nocturia or thyroid problems.  HEMATOLOGIC AND LYMPHATIC: Denies easy bruising or bleeding. SKIN: Denies rashes or lesions.  MUSCULOSKELETAL: Denies pain in neck, back, shoulders, hips, knees, or arthritic symptoms.  NEUROLOGIC: Denies paralysis, paresthesias.  PSYCHIATRIC: Denies anxiety or depressive symptoms.  Otherwise, full review of systems performed by me with is negative.   PAST MEDICAL HISTORY: Includes hyperlipidemia, unspecified; essential hypertension; gastroesophageal reflux disease without esophagitis; obstructive sleep apnea, noncompliant with CPAP therapy; chronic atrial fibrillation on warfarin for anticoagulation; history of coronary artery disease with ischemic cardiomyopathy, ejection fraction of 25%.   SOCIAL HISTORY: Denies any tobacco use. Positive for alcohol use. Denies any drug use.  FAMILY HISTORY: Positive for diabetes.   ALLERGIES: BEE STINGS.   HOME MEDICATIONS: Include digoxin 125 mcg p.o. daily, diltiazem 300 mg p.o. daily, metoprolol 100 mg p.o. b.i.d., Protonix 40 mg p.o. b.i.d. He is also on warfarin, however, does not know the dosage of this and has not taken it for over 2 weeks' time period.   PHYSICAL EXAMINATION:  VITAL SIGNS: Temperature of 98; heart rate 172, currently 120; respirations 18; blood pressure 146/89; saturating 96% on room air. Weight 181.4 kg, BMI 51.4.  GENERAL: Morbidly obese Caucasian gentleman, currently in minimal distress.  HEAD: Normocephalic, atraumatic.  EYES: Pupils equal round, react to light. Extraocular muscles intact. No scleral icterus.  MOUTH: Moist mucosal membrane. Dentition intact. No abscess noted. EAR, NOSE, THROAT: Clear without exudate. No external lesions.   NECK: Supple. No thyromegaly. No nodules. No JVD.  PULMONARY: Diminished breath sounds throughout all lung fields secondary to poor respiratory effort and blood habitus; however, no frank wheezes, rales, rhonchi. No use of accessory muscles. Poor respiratory effort, as stated above.  CARDIOVASCULAR: S1, S2. Irregular rate, irregular rhythm. No murmurs, rubs, gallops, 3+ edema of lower extremities. Pedal pulses diminished.  GASTROINTESTINAL: Soft, obese, nondistended, nontender. Positive bowel sounds. No appreciable hepatosplenomegaly, but exam limited by body habitus.   MUSCULOSKELETAL: No swelling or clubbing. Positive for edema, as described above. Range of motion full in all extremities.  NEUROLOGIC: Cranial nerves  II-XII intact. No gross focal neurological deficits. Sensation intact. Reflexes intact.  SKIN: Lower extremity diffuse erythematous lesions, consistent with chronic stasis. Otherwise, no further lesions, rashes, cyanosis. Skin warm, dry. Turgor intact.  PSYCHIATRIC: Mood and affect within normal limits. The patient awake and alert, oriented x 3. Insight and judgment intact.   LABORATORY DATA: Sodium 141, potassium 3.8, chloride 103, bicarbonate 34, BUN 20, creatinine 1.97, glucose 106. LFTs: AST of 54, otherwise within normal limits. Troponin I 0.65, trending to 0.53. WBC 11.2, hemoglobin 14.8, platelets of 220,000. Chest x-ray performed, which reveals no acute cardiopulmonary process. EKG performed which reveals atrial fibrillation with rapid ventricular response, heart rate of 180s.   ASSESSMENT AND PLAN: A 49 year old Caucasian gentleman with a history of morbid obesity; coronary artery disease with ischemic cardiomyopathy, ejection fraction of 25%; chronic atrial fibrillation presenting with because he ran out of medications. Upon arrival, noted to be in atrial fibrillation with rapid ventricular response. 1.  Atrial fibrillation with rapid ventricular response: Initiate Cardizem drip originally at 5 with goal heart rate being less than 120; if required, we will re-bolus 10 mg intravenous Cardizem and increasing drip x 5 mg/hour increments to a maximum 15. We will check an INR; however, has not taken medications for some time. We will initiate heparin drip. His CHADS score is at least 2. He has received his p.o. Cardizem. We will restart all of his p.o. medications.  2.  Acute kidney injury: Intravenous fluid hydration. Follow urine output and renal function; however, minimal intravenous fluids given history of congestive heart failure, ejection  fraction of 25%.  3.  Hypertension, essential: Continue with Lopressor.  4.  Gastroesophageal reflux disease: Proton pump inhibitor therapy.  5.  Venous thromboembolism prophylaxis: Heparin drip.   CODE STATUS: The patient is full code.   CRITICAL CARE TIME SPENT: 45 minutes.    ____________________________ Cletis Athensavid K. Tamani Durney, MD dkh:bm D: 02/04/2014 01:01:25 ET T: 02/04/2014 02:05:46 ET JOB#: [id number]  cc: Cletis Athensavid K. Romain Erion, MD, <Dictator> Kathie Posa Synetta ShadowK Danniel Tones MD ELECTRONICALLY SIGNED 02/08/2014 12:49

## 2014-06-16 DEATH — deceased

## 2017-01-02 IMAGING — CR DG KNEE COMPLETE 4+V*L*
1 series · 4 of 4 positions shown · non-contrast
Comparison: None.

CLINICAL DATA: Pain following recent fall.  Laceration over patella

EXAM:
LEFT KNEE - COMPLETE 4+ VIEW

[Series 1: ap · 0.17mm/px · 4 of 4 slices shown]
[im 1/4]
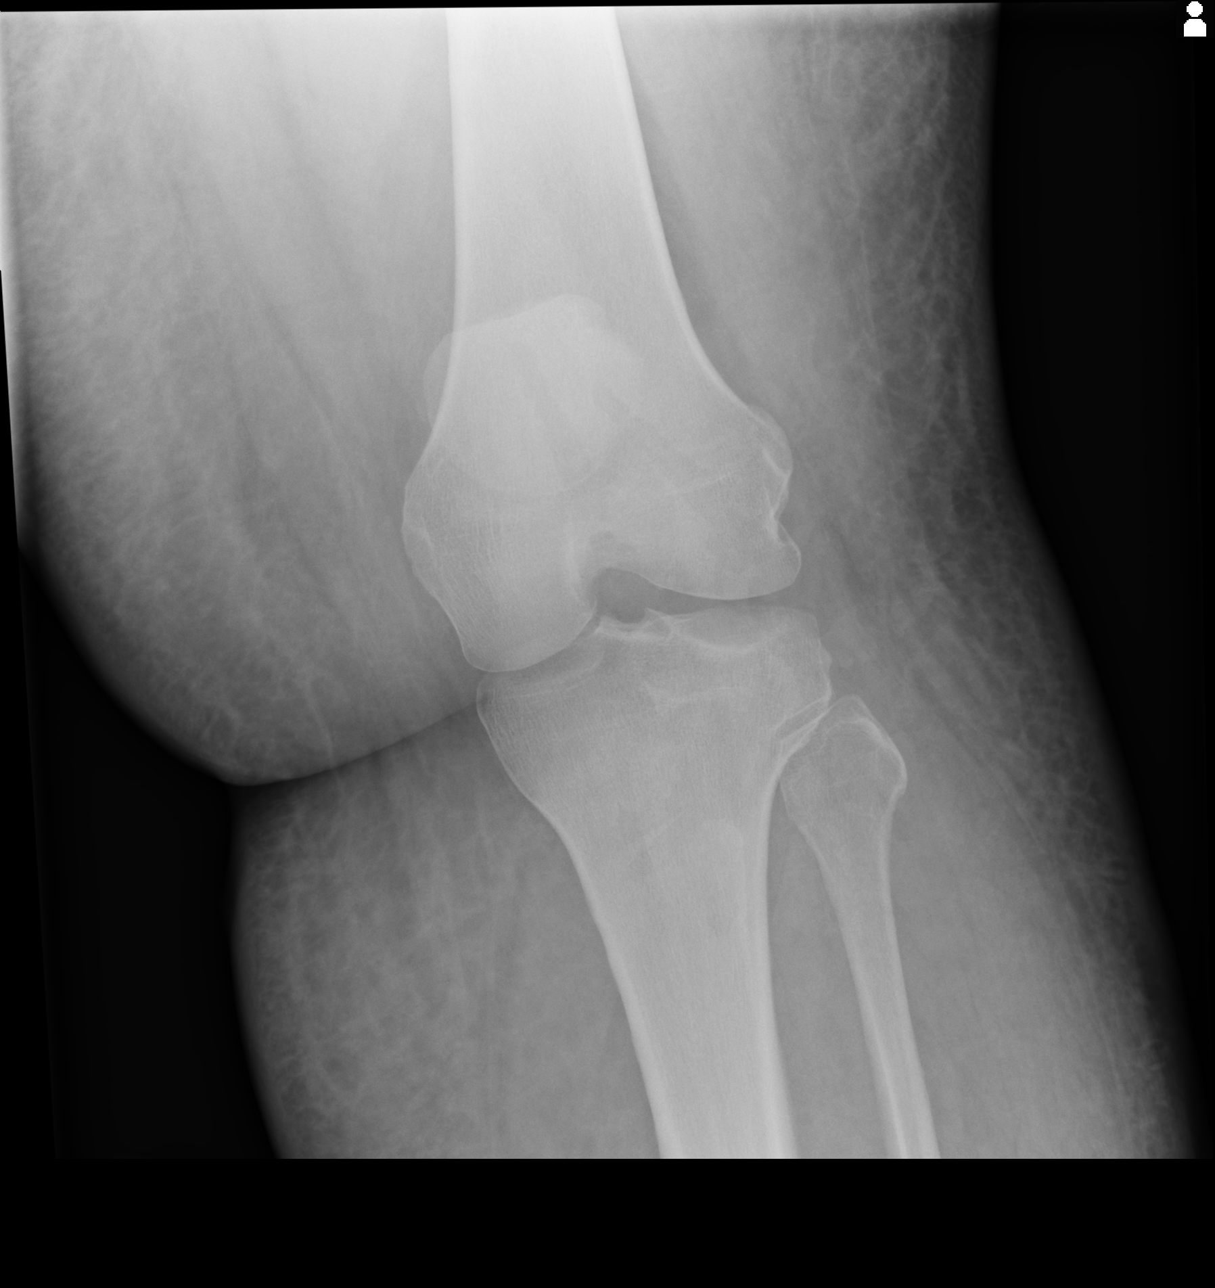
[im 2/4]
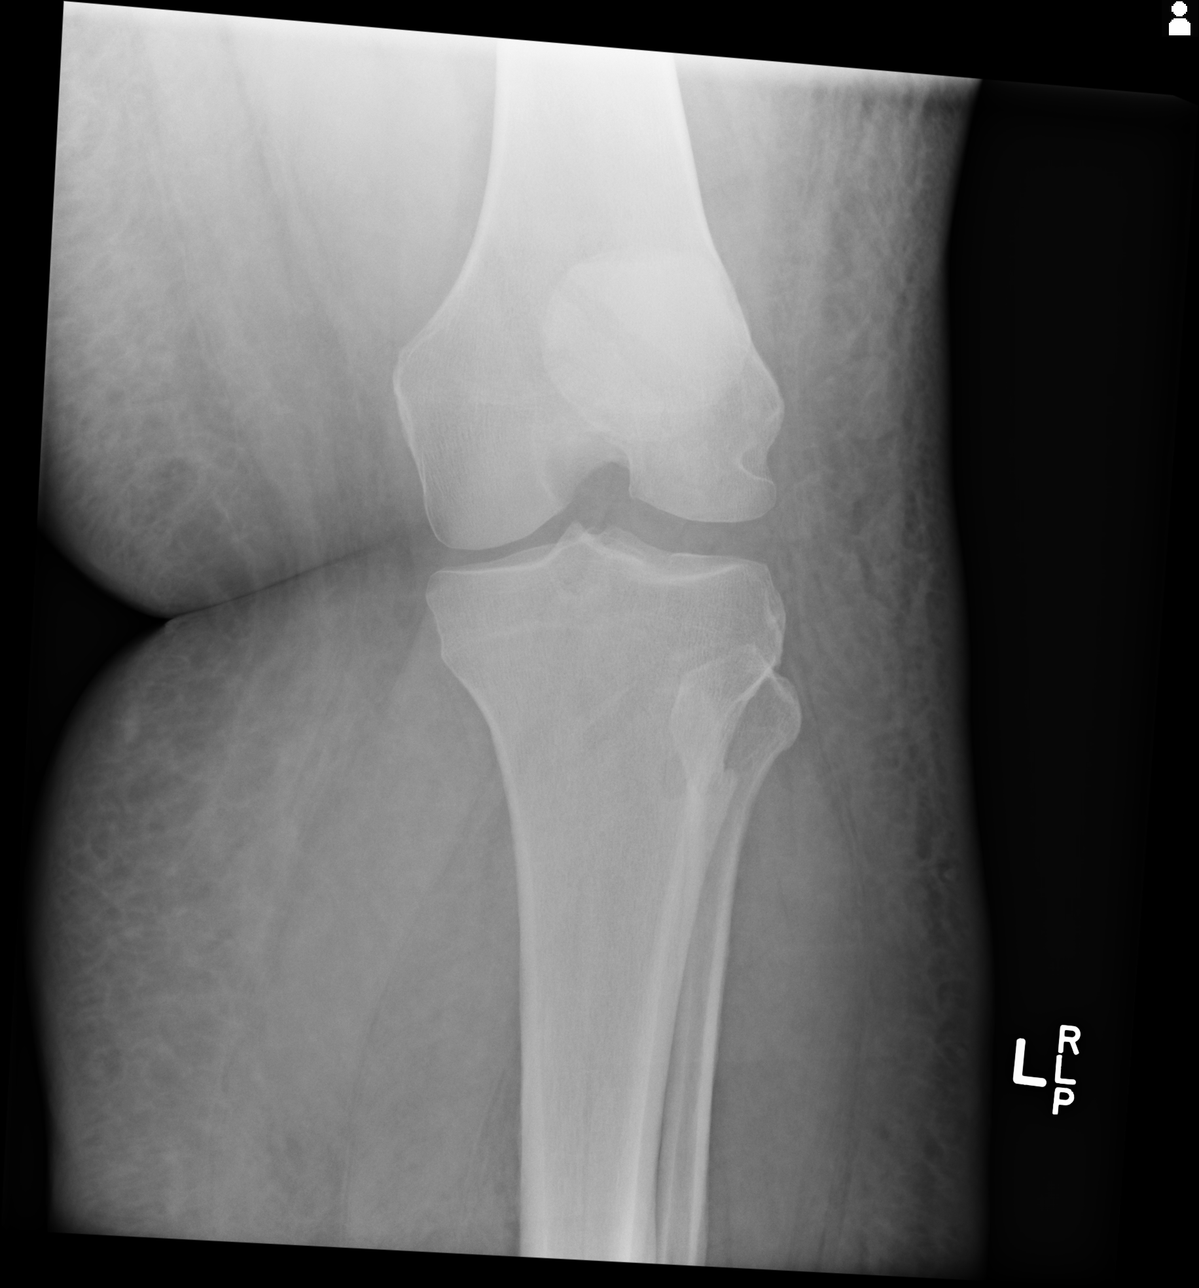
[im 3/4]
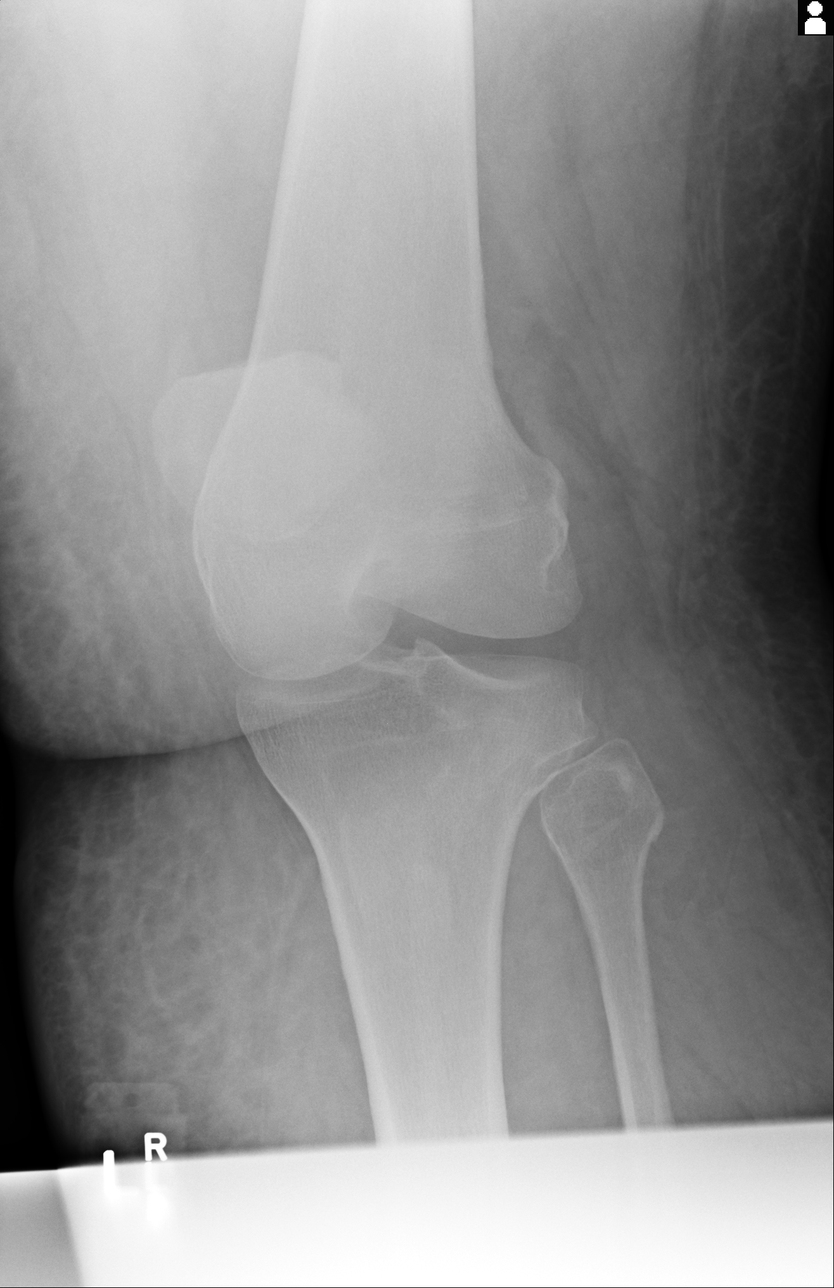
[im 4/4]
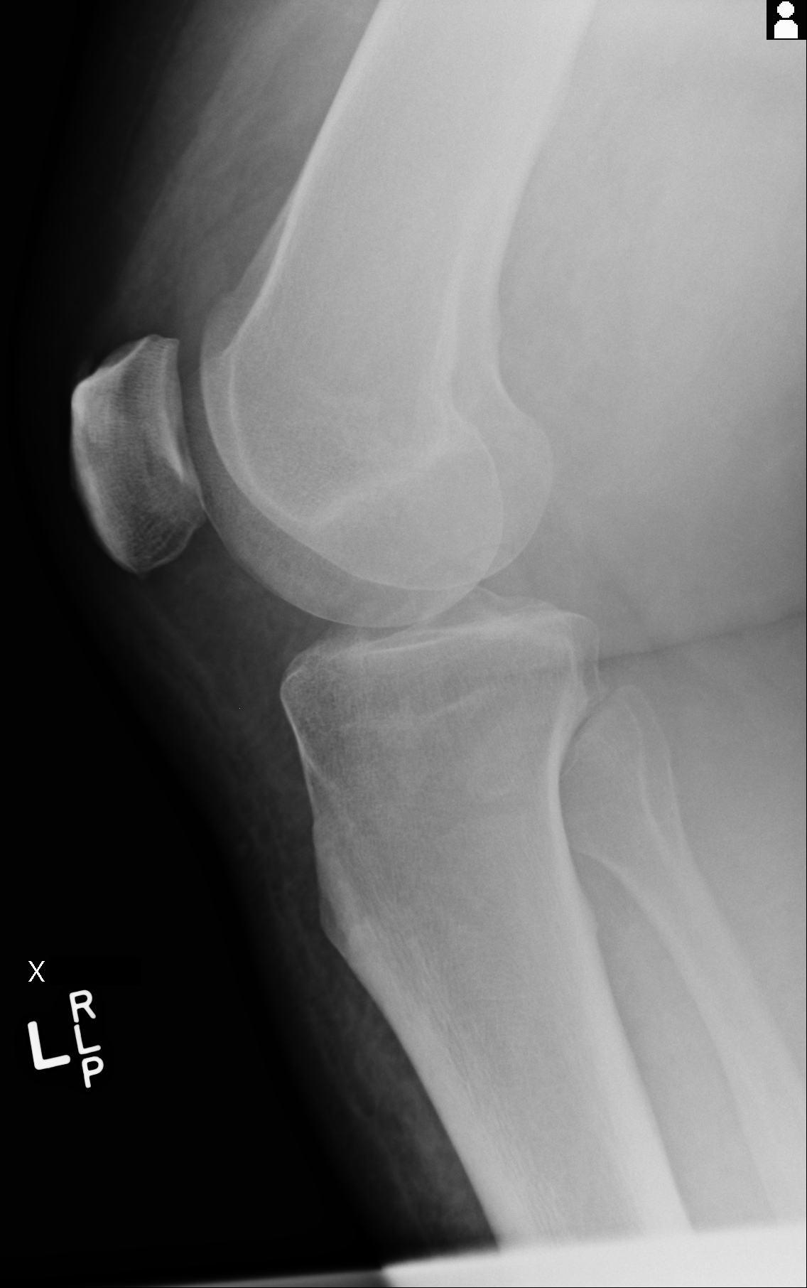

[4 of 4 positions shown; findings below may reference images not displayed]

FINDINGS: Frontal, lateral, and bilateral oblique views were obtained. No
fracture or dislocation. No joint effusion. No radiopaque foreign
body. There is mild narrowing medially. Other joint spaces appear
intact. No erosive change.
IMPRESSION: Mild narrowing medially. No fracture or dislocation. No radiopaque
foreign body. No joint effusion.

## 2017-01-12 IMAGING — US US EXTREM LOW VENOUS BILAT
1 series · 13 of 24 positions shown · non-contrast
Comparison: Right lower extremity duplex 12/20/2012, left lower
extremity duplex 09/03/2012

CLINICAL DATA: Bilateral lower extremity swelling. Long history of
edema, recent hospital admission. Evaluate for DVT.



[Series 1: us extrem low venous bilat · 0.10mm/px · 13 of 55 slices shown]
[im 1/55]
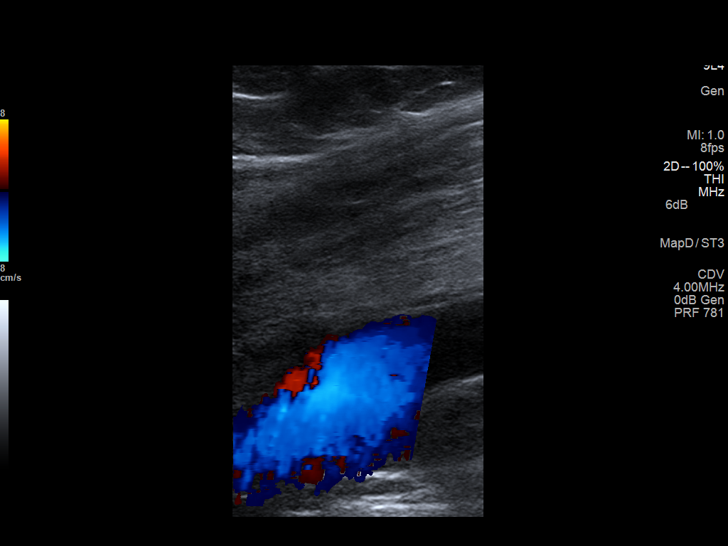
[im 5/55]
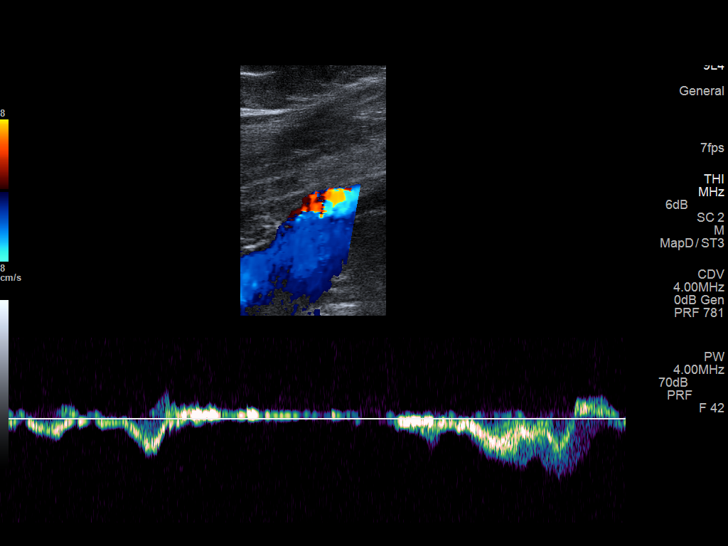
[im 10/55]
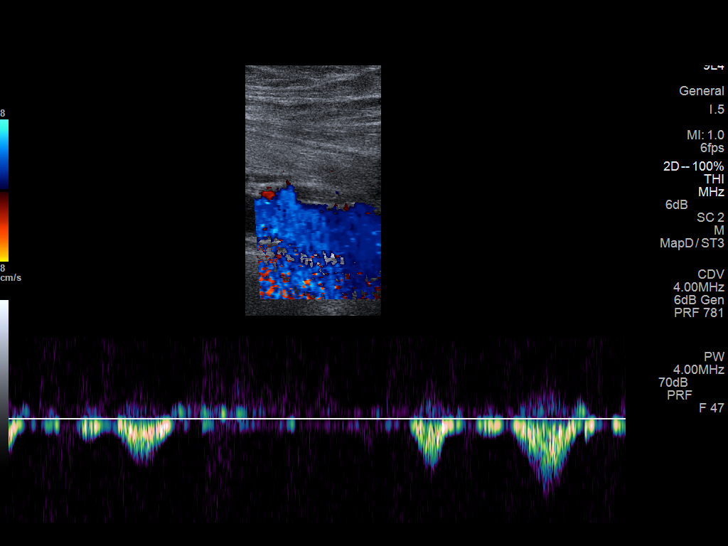
[im 15/55]
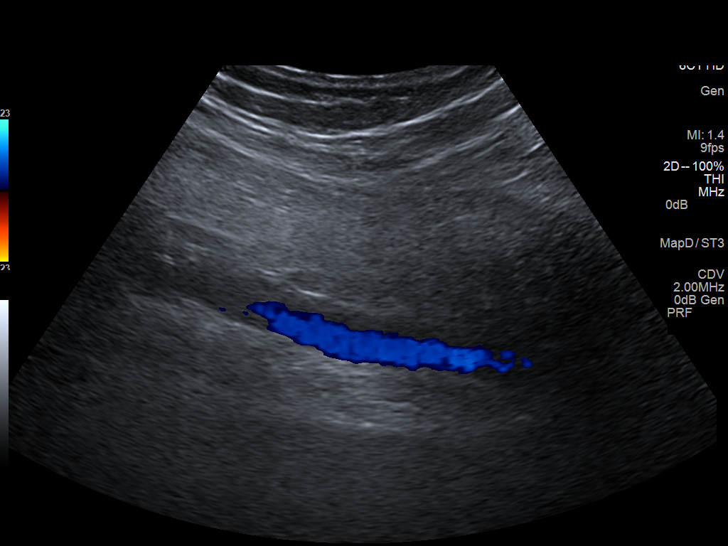
[im 19/55]
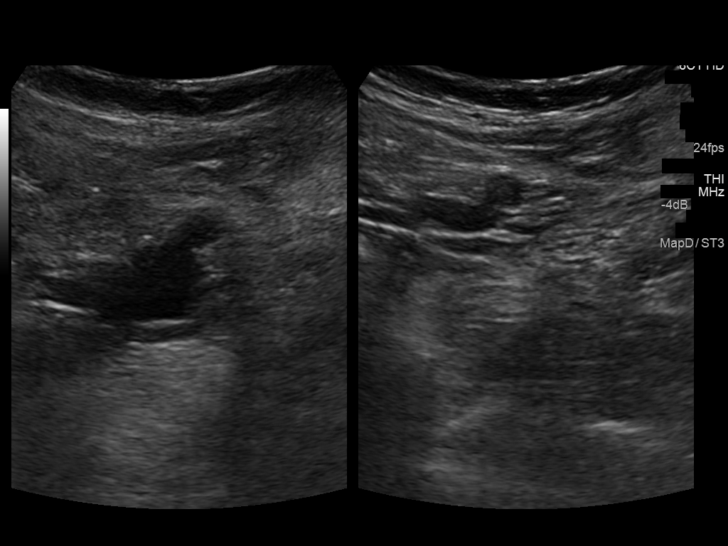
[im 24/55]
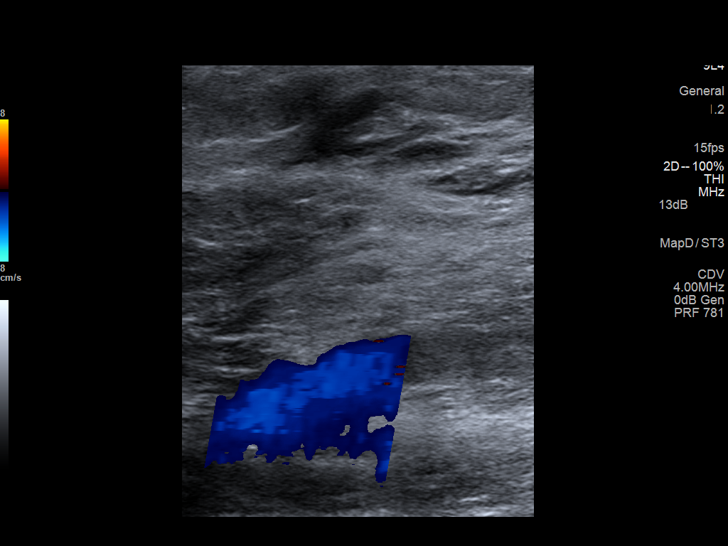
[im 29/55]
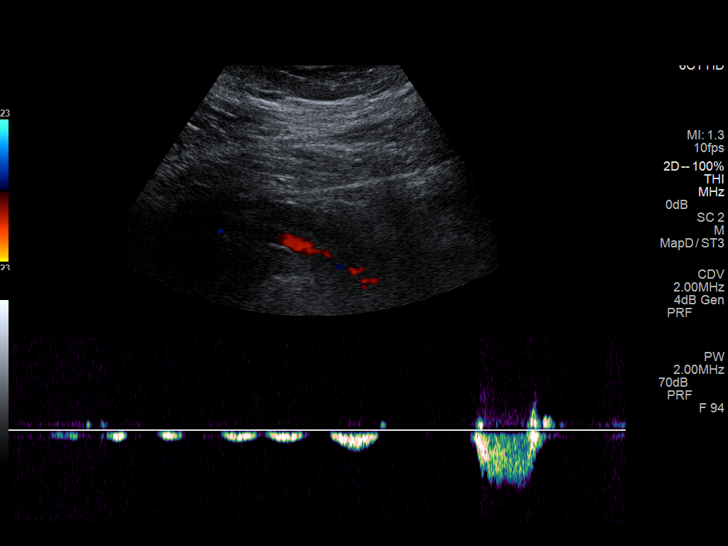
[im 31/55]
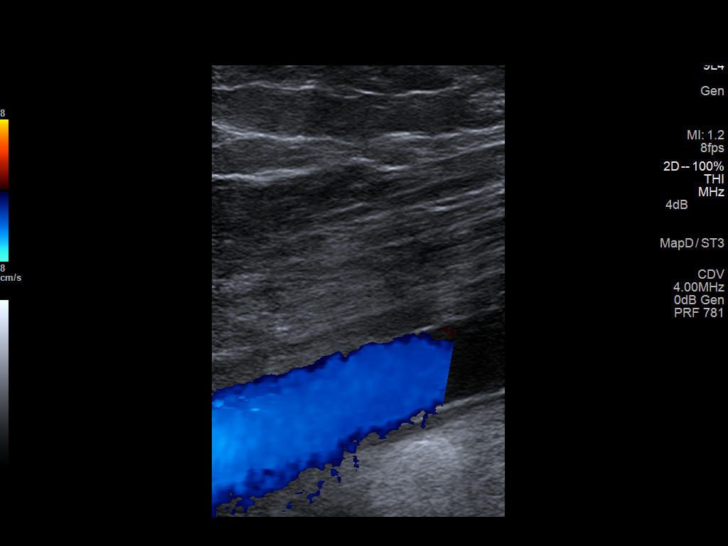
[im 36/55]
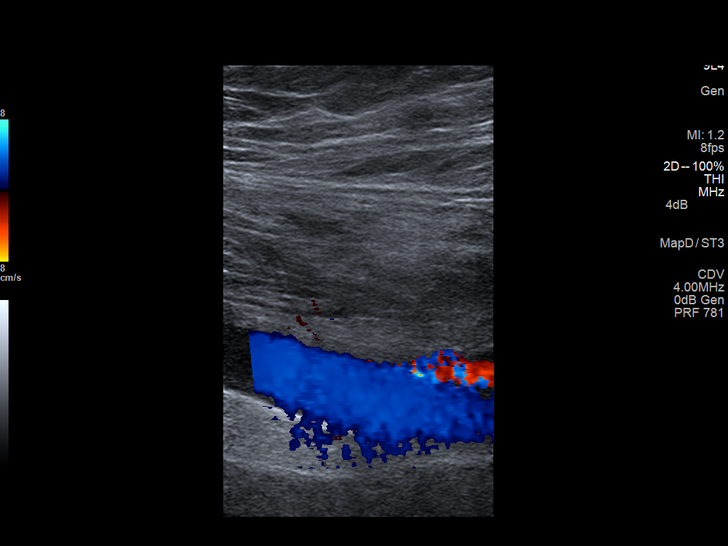
[im 40/55]
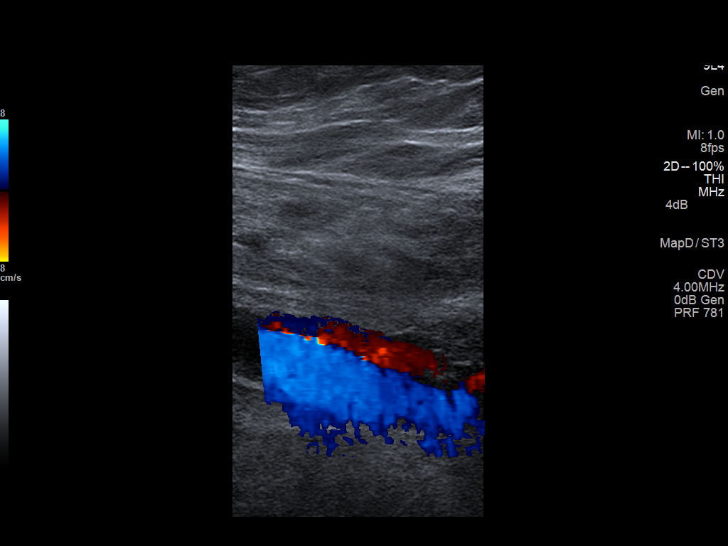
[im 45/55]
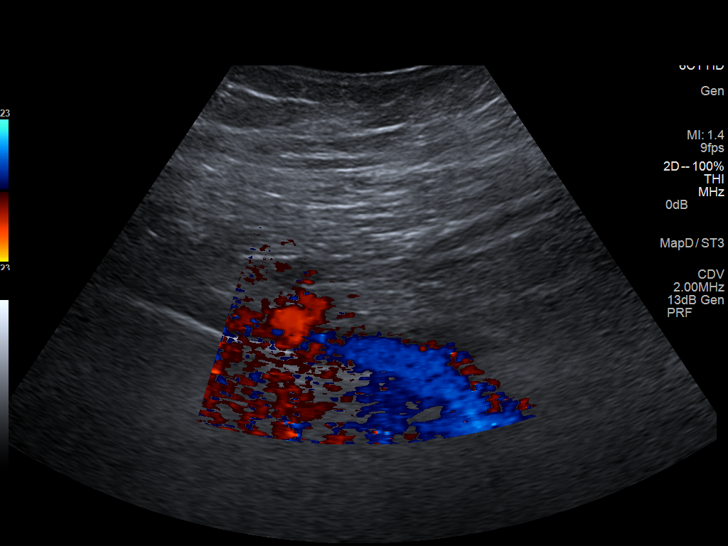
[im 50/55]
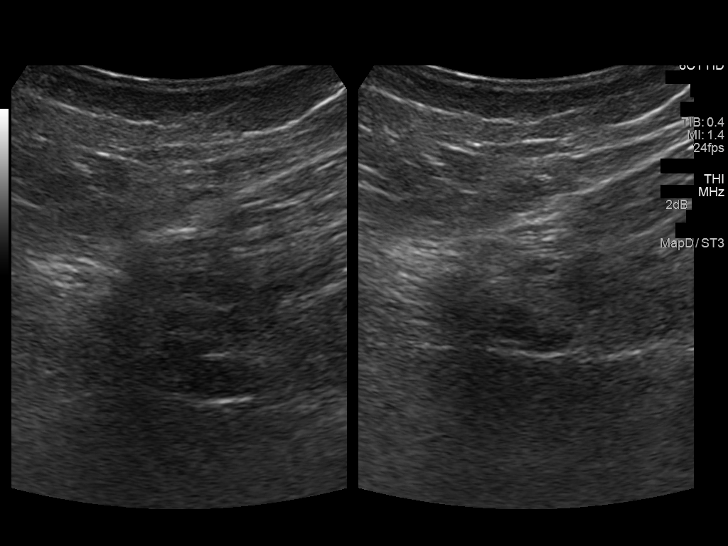
[im 55/55]
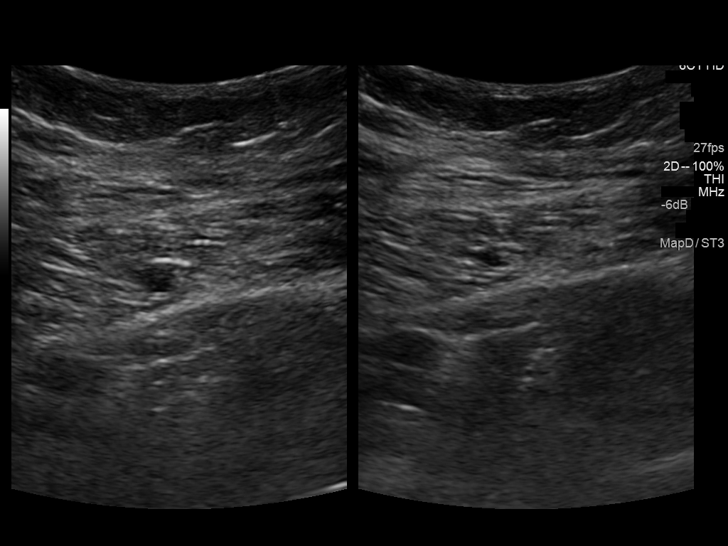

[13 of 24 positions shown; findings below may reference images not displayed]

FINDINGS: RIGHT LOWER EXTREMITY

Common Femoral Vein: No evidence of thrombus. Normal
compressibility, respiratory phasicity and response to augmentation.

Saphenofemoral Junction: No evidence of thrombus. Normal
compressibility and flow on color Doppler imaging.

Profunda Femoral Vein: No evidence of thrombus. Normal
compressibility and flow on color Doppler imaging.

Femoral Vein: No evidence of thrombus. Normal compressibility,
respiratory phasicity and response to augmentation.

Popliteal Vein: No evidence of thrombus. Normal compressibility,
respiratory phasicity and response to augmentation.

Calf Veins: Not visualized due to leg ulcers and body habitus.

Superficial Great Saphenous Vein: No evidence of thrombus. Normal
compressibility and flow on color Doppler imaging.

Venous Reflux:  None.

Other Findings:  None.

LEFT LOWER EXTREMITY

Common Femoral Vein: No evidence of thrombus. Normal
compressibility, respiratory phasicity and response to augmentation.

Saphenofemoral Junction: No evidence of thrombus. Normal
compressibility and flow on color Doppler imaging.

Profunda Femoral Vein: No evidence of thrombus. Normal
compressibility and flow on color Doppler imaging.

Femoral Vein: No evidence of thrombus. Normal compressibility,
respiratory phasicity and response to augmentation.

Popliteal Vein: No evidence of thrombus. Normal compressibility,
respiratory phasicity and response to augmentation.

Calf Veins: Not visualized due to leg ulcers and body habitus.

Superficial Great Saphenous Vein: No evidence of thrombus. Normal
compressibility and flow on color Doppler imaging.

Venous Reflux:  None.

Other Findings:  None.
IMPRESSION: No evidence of bilateral lower extremity deep venous thrombosis.
